# Patient Record
Sex: Female | Born: 1990 | Race: White | Hispanic: No | Marital: Married | Smoking: Former smoker
Health system: Southern US, Community
[De-identification: ages and names within clinical notes are randomized; demographics above are authoritative.]

## PROBLEM LIST (undated history)

## (undated) DIAGNOSIS — B019 Varicella without complication: Secondary | ICD-10-CM

## (undated) DIAGNOSIS — F419 Anxiety disorder, unspecified: Secondary | ICD-10-CM

## (undated) DIAGNOSIS — G43909 Migraine, unspecified, not intractable, without status migrainosus: Secondary | ICD-10-CM

## (undated) DIAGNOSIS — Z87442 Personal history of urinary calculi: Secondary | ICD-10-CM

## (undated) DIAGNOSIS — J324 Chronic pansinusitis: Secondary | ICD-10-CM

## (undated) DIAGNOSIS — N200 Calculus of kidney: Secondary | ICD-10-CM

## (undated) DIAGNOSIS — J309 Allergic rhinitis, unspecified: Secondary | ICD-10-CM

## (undated) DIAGNOSIS — F909 Attention-deficit hyperactivity disorder, unspecified type: Secondary | ICD-10-CM

## (undated) DIAGNOSIS — K219 Gastro-esophageal reflux disease without esophagitis: Secondary | ICD-10-CM

## (undated) DIAGNOSIS — R51 Headache: Secondary | ICD-10-CM

## (undated) DIAGNOSIS — R519 Headache, unspecified: Secondary | ICD-10-CM

## (undated) HISTORY — PX: WISDOM TOOTH EXTRACTION: SHX21

## (undated) HISTORY — DX: Anxiety disorder, unspecified: F41.9

## (undated) HISTORY — DX: Headache, unspecified: R51.9

## (undated) HISTORY — DX: Gastro-esophageal reflux disease without esophagitis: K21.9

## (undated) HISTORY — DX: Headache: R51

## (undated) HISTORY — DX: Chronic pansinusitis: J32.4

## (undated) HISTORY — DX: Allergic rhinitis, unspecified: J30.9

## (undated) HISTORY — DX: Migraine, unspecified, not intractable, without status migrainosus: G43.909

## (undated) HISTORY — DX: Personal history of urinary calculi: Z87.442

## (undated) HISTORY — DX: Attention-deficit hyperactivity disorder, unspecified type: F90.9

## (undated) HISTORY — DX: Varicella without complication: B01.9

---
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
## 2009-01-22 ENCOUNTER — Emergency Department (HOSPITAL_COMMUNITY): Admission: EM | Admit: 2009-01-22 | Discharge: 2009-01-22 | Payer: Self-pay | Admitting: Emergency Medicine

## 2010-11-12 LAB — DIFFERENTIAL
Basophils Absolute: 0
Basophils Relative: 0
Eosinophils Absolute: 0
Eosinophils Relative: 0
Lymphocytes Relative: 6 — ABNORMAL LOW
Lymphs Abs: 0.7 — ABNORMAL LOW
Monocytes Absolute: 0.5
Monocytes Relative: 4
Neutro Abs: 10.6 — ABNORMAL HIGH
Neutrophils Relative %: 90 — ABNORMAL HIGH

## 2010-11-12 LAB — BASIC METABOLIC PANEL
BUN: 11
CO2: 26
Glucose, Bld: 92
Potassium: 3.6
Sodium: 135

## 2010-11-12 LAB — CBC
HCT: 41.6
Hemoglobin: 14.2
MCHC: 34.1
MCV: 86.1
Platelets: 283
RBC: 4.83
RDW: 14
WBC: 11.8 — ABNORMAL HIGH

## 2010-11-12 LAB — HEPATIC FUNCTION PANEL
ALT: 12
AST: 19
Albumin: 3.9
Alkaline Phosphatase: 50
Bilirubin, Direct: 0.1
Indirect Bilirubin: 0.5
Total Bilirubin: 0.6
Total Protein: 7.4

## 2010-11-12 LAB — LIPASE, BLOOD: Lipase: 17

## 2011-02-12 ENCOUNTER — Emergency Department (HOSPITAL_COMMUNITY)
Admission: EM | Admit: 2011-02-12 | Discharge: 2011-02-13 | Disposition: A | Discharge status: Home / Self Care | Payer: 59 | Attending: Emergency Medicine | Admitting: Emergency Medicine

## 2011-02-12 ENCOUNTER — Encounter (HOSPITAL_COMMUNITY): Payer: Self-pay | Admitting: *Deleted

## 2011-02-12 DIAGNOSIS — N23 Unspecified renal colic: Secondary | ICD-10-CM

## 2011-02-12 DIAGNOSIS — R1031 Right lower quadrant pain: Secondary | ICD-10-CM | POA: Insufficient documentation

## 2011-02-12 DIAGNOSIS — M549 Dorsalgia, unspecified: Secondary | ICD-10-CM | POA: Insufficient documentation

## 2011-02-12 DIAGNOSIS — Z79899 Other long term (current) drug therapy: Secondary | ICD-10-CM | POA: Insufficient documentation

## 2011-02-12 LAB — URINALYSIS, ROUTINE W REFLEX MICROSCOPIC
Nitrite: NEGATIVE
Protein, ur: 30 mg/dL — AB
Urobilinogen, UA: 0.2 mg/dL (ref 0.0–1.0)

## 2011-02-12 LAB — CBC
HCT: 37 % (ref 36.0–46.0)
MCHC: 33.2 g/dL (ref 30.0–36.0)
RDW: 12.3 % (ref 11.5–15.5)

## 2011-02-12 LAB — URINE MICROSCOPIC-ADD ON

## 2011-02-12 LAB — PREGNANCY, URINE: Preg Test, Ur: NEGATIVE

## 2011-02-12 MED ORDER — SODIUM CHLORIDE 0.9 % IV SOLN
INTRAVENOUS | Status: DC
  Administered 2011-02-13: 01:00:00 via INTRAVENOUS

## 2011-02-12 NOTE — ED Notes (Signed)
Pt in c/o RLQ abd pain x2 days, worse over last hour, states she went to her PMD and was dx with UTI and right ovarian cyst

## 2011-02-12 NOTE — ED Notes (Signed)
Pt ambulated to the restroom. Will get labs when pt returns.

## 2011-02-13 ENCOUNTER — Emergency Department (HOSPITAL_COMMUNITY): Payer: 59

## 2011-02-13 LAB — COMPREHENSIVE METABOLIC PANEL
Albumin: 3.6 g/dL (ref 3.5–5.2)
Alkaline Phosphatase: 43 U/L (ref 39–117)
BUN: 12 mg/dL (ref 6–23)
Creatinine, Ser: 0.8 mg/dL (ref 0.50–1.10)
Potassium: 3.7 mEq/L (ref 3.5–5.1)
Total Protein: 7 g/dL (ref 6.0–8.3)

## 2011-02-13 LAB — WET PREP, GENITAL
Clue Cells Wet Prep HPF POC: NONE SEEN
Yeast Wet Prep HPF POC: NONE SEEN

## 2011-02-13 LAB — LIPASE, BLOOD: Lipase: 43 U/L (ref 11–59)

## 2011-02-13 MED ORDER — IOHEXOL 300 MG/ML  SOLN
100.0000 mL | Freq: Once | INTRAMUSCULAR | Status: AC | PRN
  Administered 2011-02-13: 100 mL via INTRAVENOUS

## 2011-02-13 MED ORDER — HYDROCODONE-ACETAMINOPHEN 5-500 MG PO TABS: 1.0000 | ORAL_TABLET | Freq: Four times a day (QID) | ORAL | Status: AC | PRN

## 2011-02-13 NOTE — ED Provider Notes (Signed)
History     CSN: [id number]  Arrival date & time 02/12/11  2113   First MD Initiated Contact with Patient 02/12/11 2256      Chief Complaint  Patient presents with  . Abdominal Pain    (Consider location/radiation/quality/duration/timing/severity/associated sxs/prior treatment) Patient is a 21 y.o. female presenting with abdominal pain. The history is provided by the patient and a parent.  Abdominal Pain The primary symptoms of the illness include abdominal pain. The primary symptoms of the illness do not include fever, shortness of breath or dysuria. The current episode started 2 days ago. The onset of the illness was sudden. The problem has been gradually improving.  Associated with: Nothing. The patient states that she believes she is currently not pregnant. The patient has not had a change in bowel habit. Risk factors: None. Additional symptoms associated with the illness include back pain. Symptoms associated with the illness do not include chills, anorexia, diaphoresis, heartburn, constipation, urgency, hematuria or frequency. Significant associated medical issues do not include diabetes, gallstones or liver disease.   patient evaluated at urgent care yesterday for the symptoms and do to a history of ovarian cyst was told that she has the same. She was given antibiotics that she was told was for urinary tract infection. She did not have a pelvic exam performed at that time. She was discharged home with Tramadol. Patient returns tonight with return of severe pain located in right lower quadrant, also some associated lower back pain. She's had some dark urine but has not noticed any hematuria. She has no history of kidney stones. Pain sharp in quality. No radiation. No history of same. No associated nausea or vomiting.  History reviewed. No pertinent past medical history.  History reviewed. No pertinent past surgical history.  History reviewed. No pertinent family history.  History    Substance Use Topics  . Smoking status: Not on file  . Smokeless tobacco: Not on file  . Alcohol Use: Not on file    OB History    Grav Para Term Preterm Abortions TAB SAB Ect Mult Living                  Review of Systems  Constitutional: Negative for fever, chills and diaphoresis.  HENT: Negative for neck pain and neck stiffness.   Eyes: Negative for pain.  Respiratory: Negative for shortness of breath.   Cardiovascular: Negative for chest pain.  Gastrointestinal: Positive for abdominal pain. Negative for heartburn, constipation and anorexia.  Genitourinary: Negative for dysuria, urgency, frequency and hematuria.  Musculoskeletal: Positive for back pain.  Skin: Negative for rash.  Neurological: Negative for headaches.  All other systems reviewed and are negative.    Allergies  Sulfa antibiotics  Home Medications   Current Outpatient Rx  Name Route Sig Dispense Refill  . METRONIDAZOLE 500 MG PO TABS Oral Take 500 mg by mouth 2 (two) times daily. 7 day course of therapy; started 02/11/11    . PRESCRIPTION MEDICATION Oral Take 1 tablet by mouth daily. Birth Control.    . TRAMADOL HCL 50 MG PO TABS Oral Take 50 mg by mouth every 6 (six) hours as needed. For pain.      BP 113/76  Pulse 66  Temp(Src) 97.7 F (36.5 C) (Oral)  Resp 16  SpO2 99%  LMP 02/03/2011  Physical Exam  Constitutional: She is oriented to person, place, and time. She appears well-developed and well-nourished.  HENT:  Head: Normocephalic and atraumatic.  Eyes: Conjunctivae and EOM  are normal. Pupils are equal, round, and reactive to light.  Neck: Trachea normal. Neck supple. No thyromegaly present.  Cardiovascular: Normal rate, regular rhythm, S1 normal, S2 normal and normal pulses.     No systolic murmur is present   No diastolic murmur is present  Pulses:      Radial pulses are 2+ on the right side, and 2+ on the left side.  Pulmonary/Chest: Effort normal and breath sounds normal. She has  no wheezes. She has no rhonchi. She has no rales. She exhibits no tenderness.  Abdominal: Soft. Normal appearance and bowel sounds are normal. She exhibits no mass. There is no rebound, no guarding, no CVA tenderness and negative Murphy's sign.       Tender right lower quadrant. No CVA tenderness. No peritonitis. No abdominal discoloration.  Genitourinary: Vagina normal and uterus normal. No vaginal discharge found.  Musculoskeletal:       BLE:s Calves nontender, no cords or erythema, negative Homans sign  Neurological: She is alert and oriented to person, place, and time. She has normal strength. No cranial nerve deficit or sensory deficit. GCS eye subscore is 4. GCS verbal subscore is 5. GCS motor subscore is 6.  Skin: Skin is warm and dry. No rash noted. She is not diaphoretic.  Psychiatric: Her speech is normal.       Cooperative and appropriate    ED Course  Procedures (including critical care time)  Results for orders placed during the hospital encounter of 02/12/11  URINALYSIS, ROUTINE W REFLEX MICROSCOPIC      Component Value Range   Color, Urine YELLOW  YELLOW    APPearance CLEAR  CLEAR    Specific Gravity, Urine 1.025  1.005 - 1.030    pH 7.0  5.0 - 8.0    Glucose, UA NEGATIVE  NEGATIVE (mg/dL)   Hgb urine dipstick LARGE (*) NEGATIVE    Bilirubin Urine NEGATIVE  NEGATIVE    Ketones, ur NEGATIVE  NEGATIVE (mg/dL)   Protein, ur 30 (*) NEGATIVE (mg/dL)   Urobilinogen, UA 0.2  0.0 - 1.0 (mg/dL)   Nitrite NEGATIVE  NEGATIVE    Leukocytes, UA SMALL (*) NEGATIVE   PREGNANCY, URINE      Component Value Range   Preg Test, Ur NEGATIVE    CBC      Component Value Range   WBC 10.2  4.0 - 10.5 (K/uL)   RBC 4.22  3.87 - 5.11 (MIL/uL)   Hemoglobin 12.3  12.0 - 15.0 (g/dL)   HCT 40.9  81.1 - 91.4 (%)   MCV 87.7  78.0 - 100.0 (fL)   MCH 29.1  26.0 - 34.0 (pg)   MCHC 33.2  30.0 - 36.0 (g/dL)   RDW 78.2  95.6 - 21.3 (%)   Platelets 229  150 - 400 (K/uL)  COMPREHENSIVE METABOLIC  PANEL      Component Value Range   Sodium 138  135 - 145 (mEq/L)   Potassium 3.7  3.5 - 5.1 (mEq/L)   Chloride 104  96 - 112 (mEq/L)   CO2 27  19 - 32 (mEq/L)   Glucose, Bld 93  70 - 99 (mg/dL)   BUN 12  6 - 23 (mg/dL)   Creatinine, Ser 0.86  0.50 - 1.10 (mg/dL)   Calcium 9.0  8.4 - 57.8 (mg/dL)   Total Protein 7.0  6.0 - 8.3 (g/dL)   Albumin 3.6  3.5 - 5.2 (g/dL)   AST 13  0 - 37 (U/L)   ALT  7  0 - 35 (U/L)   Alkaline Phosphatase 43  39 - 117 (U/L)   Total Bilirubin 0.2 (*) 0.3 - 1.2 (mg/dL)   GFR calc non Af Amer >90  >90 (mL/min)   GFR calc Af Amer >90  >90 (mL/min)  LIPASE, BLOOD      Component Value Range   Lipase 43  11 - 59 (U/L)  WET PREP, GENITAL      Component Value Range   Yeast, Wet Prep NONE SEEN  NONE SEEN    Trich, Wet Prep NONE SEEN  NONE SEEN    Clue Cells, Wet Prep NONE SEEN  NONE SEEN    WBC, Wet Prep HPF POC FEW (*) NONE SEEN   URINE MICROSCOPIC-ADD ON      Component Value Range   Squamous Epithelial / LPF RARE  RARE    WBC, UA 0-2  <3 (WBC/hpf)   RBC / HPF TOO NUMEROUS TO COUNT  <3 (RBC/hpf)   Bacteria, UA FEW (*) RARE    Ct Abdomen Pelvis W Contrast  02/13/2011  *RADIOLOGY REPORT*  Clinical Data: Right lower quadrant abdominal pain for 2 days.  Red blood cells and white blood cells in urine.  CT ABDOMEN AND PELVIS WITH CONTRAST  Technique:  Multidetector CT imaging of the abdomen and pelvis was performed following the standard protocol during bolus administration of intravenous contrast.  Contrast: OMNIPAQUE IOHEXOL 300 MG/ML IV SOLN  Comparison: Abdominal ultrasound performed 09/03/2006  Findings: The visualized lung bases are clear.  There is unusually heterogeneous enhancement of the liver.  This raises question for very mild nutmeg liver, due to passive venous congestion, although this is unusual given the patient's age. The spleen is unremarkable in appearance.  The gallbladder is within normal limits.  The pancreas and adrenal glands are  unremarkable.  There is suggestion of two small nonobstructing left renal stones, each measuring 2 mm in size.  It is conceivable that this reflects trace contrast within the renal calyces.  There is no evidence of hydronephrosis.  No obstructing ureteral stones are identified. The right kidney is unremarkable in appearance.  No perinephric stranding is seen.  No free fluid is identified.  The small bowel is unremarkable in appearance.  The stomach is within normal limits.  No acute vascular abnormalities are seen.  The appendix is normal in caliber and contains air, without evidence for appendicitis.  The colon is unremarkable in appearance.  The bladder is mildly distended and grossly unremarkable in appearance.  Air is noted at the vagina; the uterus is grossly unremarkable in appearance.  The ovaries are relatively symmetric; no suspicious adnexal masses are seen.  No inguinal lymphadenopathy is seen.  No acute osseous abnormalities are identified.  IMPRESSION:  1.  No evidence of appendicitis. 2.  Suggestion of two small nonobstructing left renal stones, each measuring 2 mm in size.  This could conceivably instead reflect trace contrast within the renal calyces.  No evidence of hydronephrosis. 3.  Mildly heterogeneous enhancement of the liver raises question for very mild nutmeg liver, due to passive venous congestion, though this is unusual given the patient's age.  Alternatively, it could simply reflect the phase of contrast enhancement.  Original Report Authenticated By: Tonia Ghent, M.D.     she declines pain medicines in the ED. UA CT reviewed as above. Suspect patient has a kidney stone not seen on contrasted CT scan. No significant pain in the ED.  MDM   Right lower quadrant  pain likely ureteral colic. Urology referral. Pain medications and kidney stone precautions.        Sunnie Nielsen, MD 02/13/11 [phone]

## 2012-03-15 ENCOUNTER — Emergency Department (HOSPITAL_COMMUNITY)
Admission: EM | Admit: 2012-03-15 | Discharge: 2012-03-15 | Disposition: A | Discharge status: Home / Self Care | Payer: 59 | Attending: Emergency Medicine | Admitting: Emergency Medicine

## 2012-03-15 ENCOUNTER — Encounter (HOSPITAL_COMMUNITY): Payer: Self-pay | Admitting: *Deleted

## 2012-03-15 DIAGNOSIS — Z87448 Personal history of other diseases of urinary system: Secondary | ICD-10-CM | POA: Insufficient documentation

## 2012-03-15 DIAGNOSIS — Y9389 Activity, other specified: Secondary | ICD-10-CM | POA: Insufficient documentation

## 2012-03-15 DIAGNOSIS — M5380 Other specified dorsopathies, site unspecified: Secondary | ICD-10-CM | POA: Insufficient documentation

## 2012-03-15 DIAGNOSIS — R000 Tachycardia, unspecified: Secondary | ICD-10-CM | POA: Insufficient documentation

## 2012-03-15 DIAGNOSIS — Z79899 Other long term (current) drug therapy: Secondary | ICD-10-CM | POA: Insufficient documentation

## 2012-03-15 DIAGNOSIS — IMO0002 Reserved for concepts with insufficient information to code with codable children: Secondary | ICD-10-CM | POA: Insufficient documentation

## 2012-03-15 DIAGNOSIS — X503XXA Overexertion from repetitive movements, initial encounter: Secondary | ICD-10-CM | POA: Insufficient documentation

## 2012-03-15 DIAGNOSIS — F17200 Nicotine dependence, unspecified, uncomplicated: Secondary | ICD-10-CM | POA: Insufficient documentation

## 2012-03-15 DIAGNOSIS — M5430 Sciatica, unspecified side: Secondary | ICD-10-CM | POA: Insufficient documentation

## 2012-03-15 DIAGNOSIS — M62830 Muscle spasm of back: Secondary | ICD-10-CM

## 2012-03-15 DIAGNOSIS — Y929 Unspecified place or not applicable: Secondary | ICD-10-CM | POA: Insufficient documentation

## 2012-03-15 HISTORY — DX: Calculus of kidney: N20.0

## 2012-03-15 MED ORDER — HYDROCODONE-ACETAMINOPHEN 5-325 MG PO TABS
2.0000 | ORAL_TABLET | Freq: Once | ORAL | Status: AC
  Administered 2012-03-15: 2 via ORAL
  Filled 2012-03-15: qty 2

## 2012-03-15 MED ORDER — DIAZEPAM 5 MG PO TABS
10.0000 mg | ORAL_TABLET | Freq: Once | ORAL | Status: AC
  Administered 2012-03-15: 10 mg via ORAL
  Filled 2012-03-15: qty 2

## 2012-03-15 MED ORDER — METHOCARBAMOL 750 MG PO TABS: 750.0000 mg | ORAL_TABLET | Freq: Four times a day (QID) | ORAL | Status: DC | PRN

## 2012-03-15 MED ORDER — HYDROCODONE-ACETAMINOPHEN 5-325 MG PO TABS: 1.0000 | ORAL_TABLET | Freq: Four times a day (QID) | ORAL | Status: DC | PRN

## 2012-03-15 MED ORDER — NAPROXEN 500 MG PO TABS: 500.0000 mg | ORAL_TABLET | Freq: Two times a day (BID) | ORAL | Status: DC | PRN

## 2012-03-15 NOTE — ED Provider Notes (Signed)
History    This chart was scribed for non-physician practitioner working with Leonette Most B. Bernette Mayers, MD by Donne Anon, ED Scribe. This patient was seen in room WTR9/WTR9 and the patient's care was started at 2137.   CSN: [id number]  Arrival date & time 03/15/12  2037   First MD Initiated Contact with Patient 03/15/12 2137      Chief Complaint  Patient presents with  . Back Pain     The history is provided by the patient and medical records. No language interpreter was used.   Brittany Collins is a 22 y.o. female who presents to the Emergency Department complaining of sudden onset, constant, non changing lower back pain which radiates down to her buttock and right leg and began while she was moving boxes 3.5 hours PTA. She describes the pain as throbbing and stabbing. She reports the pain is worse while sitting and standing, and best when she is lying flat. She denies numbness or tingling in her legs or any other pain. She denies any similar episodes. She tried Excedrin with no relief.  Pain is reproducible with ambulation.  Past Medical History  Diagnosis Date  . Kidney stones     History reviewed. No pertinent past surgical history.  History reviewed. No pertinent family history.  History  Substance Use Topics  . Smoking status: Current Every Day Smoker    Types: Cigarettes  . Smokeless tobacco: Not on file  . Alcohol Use: Yes     Comment: occasionally    Review of Systems  Constitutional: Negative for fever and fatigue.  HENT: Negative for neck pain and neck stiffness.   Respiratory: Negative for chest tightness and shortness of breath.   Cardiovascular: Negative for chest pain.  Gastrointestinal: Negative for nausea, vomiting, abdominal pain and diarrhea.  Genitourinary: Negative for dysuria, urgency, frequency and hematuria.  Musculoskeletal: Positive for back pain and gait problem ( 2/2 pain). Negative for joint swelling.  Skin: Negative for rash.  Neurological: Negative  for weakness, light-headedness, numbness and headaches.  All other systems reviewed and are negative.    Allergies  Tramadol and Sulfa antibiotics  Home Medications   Current Outpatient Rx  Name  Route  Sig  Dispense  Refill  . HYDROcodone-acetaminophen (NORCO/VICODIN) 5-325 MG per tablet   Oral   Take 1 tablet by mouth every 6 (six) hours as needed for pain (Take 1 - 2 tablets every 4 - 6 hours.).   20 tablet   0   . methocarbamol (ROBAXIN) 750 MG tablet   Oral   Take 1 tablet (750 mg total) by mouth 4 (four) times daily as needed (Take 1 tablet every 6 hours as needed for muscle spasms.).   20 tablet   0   . naproxen (NAPROSYN) 500 MG tablet   Oral   Take 1 tablet (500 mg total) by mouth 2 (two) times daily as needed.   30 tablet   0     BP 110/70  Pulse 109  Temp(Src) 98.7 F (37.1 C) (Oral)  Resp 19  Ht 5\' 4"  (1.626 m)  Wt 113 lb (51.256 kg)  BMI 19.39 kg/m2  SpO2 100%  LMP 02/23/2012  Physical Exam  Nursing note and vitals reviewed. Constitutional: She appears well-developed and well-nourished. No distress.  HENT:  Head: Normocephalic and atraumatic.  Mouth/Throat: Oropharynx is clear and moist. No oropharyngeal exudate.  Eyes: Conjunctivae are normal. Pupils are equal, round, and reactive to light. No scleral icterus.  Neck: Normal range of  motion and full passive range of motion without pain. Neck supple. No spinous process tenderness and no muscular tenderness present. Normal range of motion present.  Full ROM without pain  Cardiovascular: Regular rhythm, normal heart sounds and intact distal pulses.  Tachycardia present.  Exam reveals no gallop and no friction rub.   No murmur heard. Pulses:      Radial pulses are 2+ on the right side, and 2+ on the left side.       Dorsalis pedis pulses are 2+ on the right side, and 2+ on the left side.       Posterior tibial pulses are 2+ on the right side, and 2+ on the left side.  Pulmonary/Chest: Effort normal  and breath sounds normal. No respiratory distress. She has no wheezes.  Abdominal: Soft. Bowel sounds are normal. She exhibits no distension and no mass. There is no tenderness. There is no rebound and no guarding.  Musculoskeletal: She exhibits tenderness. She exhibits no edema.       Lumbar back: She exhibits decreased range of motion, tenderness, pain and spasm. She exhibits no bony tenderness, no swelling, no edema, no deformity, no laceration and normal pulse.       Back:  Good distal pulses and sensation. No C or T spine tenderness. Right paraspinal tenderness of the L spine.  Lymphadenopathy:    She has no cervical adenopathy.  Neurological: She is alert. She has normal reflexes. GCS eye subscore is 4. GCS verbal subscore is 5. GCS motor subscore is 6.  Reflex Scores:      Tricep reflexes are 2+ on the right side and 2+ on the left side.      Bicep reflexes are 2+ on the right side and 2+ on the left side.      Brachioradialis reflexes are 2+ on the right side and 2+ on the left side.      Patellar reflexes are 2+ on the right side and 2+ on the left side.      Achilles reflexes are 2+ on the right side and 2+ on the left side. Speech is clear and goal oriented, follows commands Normal strength in upper and lower extremities bilaterally including dorsiflexion and plantar flexion, strong and equal grip strength Sensation normal to light and sharp touch Moves extremities without ataxia, coordination intact Normal gait and balance  Skin: Skin is warm and dry. No rash noted. She is not diaphoretic. No erythema.  Psychiatric: She has a normal mood and affect.    ED Course  Procedures (including critical care time) DIAGNOSTIC STUDIES: Oxygen Saturation is 100% on room air, normal by my interpretation.    COORDINATION OF CARE: 9:43 PM Discussed treatment plan which includes ice, heat, rest, stretching and medication with pt at bedside and pt agreed to plan.     Labs Reviewed - No  data to display No results found.   1. Back muscle spasm   2. Sciatica       MDM  Eudelia Bunch presents with Hx and PE consistent with sciatica.  Pt without trauma and I do not believe that imaging is indicated at this time.  Normal neurological exam, no evidence of urinary incontinence or retention, pain is consistently reproducible. There is no evidence of AAA or concern for dissection at this time.   Patient can walk but states is painful.  No loss of bowel or bladder control.  No concern for cauda equina.  No fever, night sweats, weight loss, h/o  cancer, IVDU.  Pain treated here in the department with adequate improvement. RICE protocol and pain medicine indicated and discussed with patient. I have also discussed reasons to return immediately to the ER.  Patient expresses understanding and agrees with plan.  1. Medications: robaxin, naproxyn, vicodin, usual home medications 2. Treatment: rest, drink plenty of fluids, gentle stretching as discussed, alternate ice and heat 3. Follow Up: Please followup with your primary doctor for discussion of your diagnoses and further evaluation after today's visit; if you do not have a primary care doctor use the resource guide provided to find one;   I personally performed the services described in this documentation, which was scribed in my presence. The recorded information has been reviewed and is accurate.         Dahlia Client Maclean Foister, PA-C 03/15/12 2210

## 2012-03-15 NOTE — ED Notes (Signed)
Pt c/o R lower back pain radiating down R leg since moving heavy boxes at 1700 today. Pt states pain worse w/ position change and best when lying flat.

## 2012-03-15 NOTE — ED Provider Notes (Signed)
Medical screening examination/treatment/procedure(s) were performed by non-physician practitioner and as supervising physician I was immediately available for consultation/collaboration.   Charles B. Bernette Mayers, MD 03/15/12 2211

## 2012-09-03 ENCOUNTER — Emergency Department (HOSPITAL_COMMUNITY)
Admission: EM | Admit: 2012-09-03 | Discharge: 2012-09-03 | Disposition: A | Discharge status: Home / Self Care | Payer: Self-pay | Attending: Emergency Medicine | Admitting: Emergency Medicine

## 2012-09-03 ENCOUNTER — Encounter (HOSPITAL_COMMUNITY): Payer: Self-pay | Admitting: Emergency Medicine

## 2012-09-03 DIAGNOSIS — Z79899 Other long term (current) drug therapy: Secondary | ICD-10-CM | POA: Insufficient documentation

## 2012-09-03 DIAGNOSIS — Z87442 Personal history of urinary calculi: Secondary | ICD-10-CM | POA: Insufficient documentation

## 2012-09-03 DIAGNOSIS — F17200 Nicotine dependence, unspecified, uncomplicated: Secondary | ICD-10-CM | POA: Insufficient documentation

## 2012-09-03 DIAGNOSIS — L259 Unspecified contact dermatitis, unspecified cause: Secondary | ICD-10-CM | POA: Insufficient documentation

## 2012-09-03 MED ORDER — HYDROCORTISONE 2.5 % EX LOTN: TOPICAL_LOTION | Freq: Two times a day (BID) | CUTANEOUS | Status: DC

## 2012-09-03 MED ORDER — PREDNISONE 20 MG PO TABS: 60.0000 mg | ORAL_TABLET | Freq: Every day | ORAL | Status: DC

## 2012-09-03 NOTE — ED Provider Notes (Signed)
CSN: [id number]     Arrival date & time 09/03/12  4098 History     First MD Initiated Contact with Patient 09/03/12 [phone]     Chief Complaint  Patient presents with  . Rash   (Consider location/radiation/quality/duration/timing/severity/associated sxs/prior Treatment) Patient is a 22 y.o. female presenting with rash. The history is provided by the patient.  Rash Location:  Face, shoulder/arm and leg Facial rash location:  R cheek Shoulder/arm rash location:  L forearm Leg rash location:  L upper leg and R upper leg Quality: dryness, itchiness, redness and scaling   Quality: not draining, not painful, not swelling and not weeping   Onset quality:  Gradual Progression:  Worsening Context: not exposure to similar rash, not food, not insect bite/sting, not medications and not new detergent/soap   Relieved by:  Nothing Ineffective treatments:  Anti-itch cream Associated symptoms: no abdominal pain, no fever, no nausea, no shortness of breath, no sore throat, no throat swelling, no tongue swelling and not wheezing     Past Medical History  Diagnosis Date  . Kidney stones    No past surgical history on file. No family history on file. History  Substance Use Topics  . Smoking status: Current Every Day Smoker    Types: Cigarettes  . Smokeless tobacco: Not on file  . Alcohol Use: Yes     Comment: occasionally   OB History   Grav Para Term Preterm Abortions TAB SAB Ect Mult Living                 Review of Systems  Constitutional: Negative for fever and chills.  HENT: Negative for sore throat.   Respiratory: Negative for shortness of breath and wheezing.   Gastrointestinal: Negative for nausea and abdominal pain.  Skin: Positive for rash.    Allergies  Tramadol and Sulfa antibiotics  Home Medications   Current Outpatient Rx  Name  Route  Sig  Dispense  Refill  . dextroamphetamine (DEXTROSTAT) 10 MG tablet   Oral   Take 10 mg by mouth 4 (four) times daily.          . hydrocortisone cream 1 %   Topical   Apply 1 application topically 2 (two) times daily.          BP 110/65  Pulse 101  Temp(Src) 98.2 F (36.8 C)  Resp 16  SpO2 100% Physical Exam  Nursing note and vitals reviewed. Constitutional: She appears well-developed and well-nourished. No distress.  HENT:  Head: Normocephalic and atraumatic.  Mouth/Throat: Oropharynx is clear and moist.  No swelling of lips, tongue, or throat  Neck: Normal range of motion. Neck supple.  Cardiovascular: Normal rate, regular rhythm and normal heart sounds.   Pulmonary/Chest: Effort normal and breath sounds normal. No respiratory distress. She has no wheezes.  Musculoskeletal: Normal range of motion.  Neurological: She is alert.  Skin: Skin is warm and dry. No petechiae and no purpura noted. She is not diaphoretic.  Excoriated erythematous papular rash on the buttocks, left forearm, upper thighs, and right side of the lower face.    ED Course   Procedures (including critical care time)  Labs Reviewed - No data to display No results found. No diagnosis found.  MDM  Patient presenting with a rash.  Appearance of rash most consistent with Contact Dermatitis.  Probable Poison Ivy.  Patient instructed to take Benadryl, apply Hydrocortisone, and also given short course of Prednisone.  Patient stable for discharge.  Kellin Fifer Anne Shutter, PA-C  09/04/12 0815 

## 2012-09-03 NOTE — ED Notes (Signed)
Patches of Rash on arm face  And bottom x 2 days itchy

## 2012-09-09 NOTE — ED Provider Notes (Signed)
Medical screening examination/treatment/procedure(s) were performed by non-physician practitioner and as supervising physician I was immediately available for consultation/collaboration.   Jaclynne Baldo B. Bernette Mayers, MD 09/09/12 1113

## 2012-09-17 ENCOUNTER — Emergency Department (HOSPITAL_COMMUNITY)
Admission: EM | Admit: 2012-09-17 | Discharge: 2012-09-17 | Discharge status: Against Medical Advice | Payer: Self-pay | Attending: Emergency Medicine | Admitting: Emergency Medicine

## 2012-09-17 ENCOUNTER — Encounter (HOSPITAL_COMMUNITY): Payer: Self-pay | Admitting: Emergency Medicine

## 2012-09-17 DIAGNOSIS — R0789 Other chest pain: Secondary | ICD-10-CM | POA: Insufficient documentation

## 2012-09-17 NOTE — ED Notes (Signed)
PT. REPORTS MID CHEST PAIN / UPPER BACK PAIN ONSET THIS AFTERNOON , DENIES INJURY , NO SOB OR NAUSEA .

## 2012-09-22 ENCOUNTER — Emergency Department (HOSPITAL_COMMUNITY)
Admission: EM | Admit: 2012-09-22 | Discharge: 2012-09-22 | Disposition: A | Discharge status: Home / Self Care | Payer: Self-pay | Attending: Emergency Medicine | Admitting: Emergency Medicine

## 2012-09-22 ENCOUNTER — Emergency Department (HOSPITAL_COMMUNITY): Payer: Self-pay

## 2012-09-22 ENCOUNTER — Encounter (HOSPITAL_COMMUNITY): Payer: Self-pay | Admitting: Emergency Medicine

## 2012-09-22 DIAGNOSIS — Z87442 Personal history of urinary calculi: Secondary | ICD-10-CM | POA: Insufficient documentation

## 2012-09-22 DIAGNOSIS — Z3202 Encounter for pregnancy test, result negative: Secondary | ICD-10-CM | POA: Insufficient documentation

## 2012-09-22 DIAGNOSIS — Z79899 Other long term (current) drug therapy: Secondary | ICD-10-CM | POA: Insufficient documentation

## 2012-09-22 DIAGNOSIS — R079 Chest pain, unspecified: Secondary | ICD-10-CM

## 2012-09-22 DIAGNOSIS — F17200 Nicotine dependence, unspecified, uncomplicated: Secondary | ICD-10-CM | POA: Insufficient documentation

## 2012-09-22 DIAGNOSIS — R0789 Other chest pain: Secondary | ICD-10-CM | POA: Insufficient documentation

## 2012-09-22 DIAGNOSIS — R109 Unspecified abdominal pain: Secondary | ICD-10-CM | POA: Insufficient documentation

## 2012-09-22 DIAGNOSIS — M549 Dorsalgia, unspecified: Secondary | ICD-10-CM | POA: Insufficient documentation

## 2012-09-22 LAB — CBC WITH DIFFERENTIAL/PLATELET
Basophils Absolute: 0 10*3/uL (ref 0.0–0.1)
Basophils Relative: 0 % (ref 0–1)
MCHC: 35.4 g/dL (ref 30.0–36.0)
Neutro Abs: 5.9 10*3/uL (ref 1.7–7.7)
Neutrophils Relative %: 69 % (ref 43–77)
Platelets: 205 10*3/uL (ref 150–400)
RDW: 12.8 % (ref 11.5–15.5)

## 2012-09-22 LAB — COMPREHENSIVE METABOLIC PANEL
AST: 16 U/L (ref 0–37)
Albumin: 3.6 g/dL (ref 3.5–5.2)
Alkaline Phosphatase: 54 U/L (ref 39–117)
Chloride: 107 mEq/L (ref 96–112)
Creatinine, Ser: 0.82 mg/dL (ref 0.50–1.10)
Potassium: 3.6 mEq/L (ref 3.5–5.1)
Total Bilirubin: 0.5 mg/dL (ref 0.3–1.2)

## 2012-09-22 LAB — D-DIMER, QUANTITATIVE: D-Dimer, Quant: 0.32 ug/mL-FEU (ref 0.00–0.48)

## 2012-09-22 LAB — TROPONIN I: Troponin I: <0.3 ng/mL (ref <0.30)

## 2012-09-22 MED ORDER — FAMOTIDINE 20 MG PO TABS
20.0000 mg | ORAL_TABLET | Freq: Once | ORAL | Status: AC
  Administered 2012-09-22: 20 mg via ORAL
  Filled 2012-09-22: qty 1

## 2012-09-22 MED ORDER — FAMOTIDINE 20 MG PO TABS: 20.0000 mg | ORAL_TABLET | Freq: Two times a day (BID) | ORAL | Status: DC

## 2012-09-22 NOTE — ED Notes (Signed)
Discharge instructions reviewed with pt. Pt verbalized understanding.   

## 2012-09-22 NOTE — ED Notes (Signed)
Pt c/o right sided CP into back x 5 days; pt sts seen here last Thursday for same but had to leave; pt sts some cough

## 2012-09-22 NOTE — ED Provider Notes (Signed)
CSN: [id number]     Arrival date & time 09/22/12  1041 History   First MD Initiated Contact with Patient 09/22/12 1119     Chief Complaint  Patient presents with  . Chest Pain   (Consider location/radiation/quality/duration/timing/severity/associated sxs/prior Treatment) Patient is a 22 y.o. female presenting with chest pain. The history is provided by the patient.  Chest Pain Associated symptoms: abdominal pain and back pain   Associated symptoms: no fever, no headache, no nausea, no shortness of breath and not vomiting    patient with complaint of chest pain on and off since Thursday returned just today lasted for 2 hours. Then today was for one hour. Not associated with nausea vomiting or shortness of breath. Pain does radiate somewhat to the back. Pain is epigastric radiates into the substernal area and then to the back rates it about an 8/10 when is present. As stated is intermittent. Not made better or worse by anything.  Past Medical History  Diagnosis Date  . Kidney stones    History reviewed. No pertinent past surgical history. History reviewed. No pertinent family history. History  Substance Use Topics  . Smoking status: Current Every Day Smoker    Types: Cigarettes  . Smokeless tobacco: Not on file  . Alcohol Use: Yes     Comment: occasionally   OB History   Grav Para Term Preterm Abortions TAB SAB Ect Mult Living                 Review of Systems  Constitutional: Negative for fever.  HENT: Negative for congestion.   Eyes: Negative for redness.  Respiratory: Positive for chest tightness. Negative for shortness of breath.   Cardiovascular: Positive for chest pain. Negative for leg swelling.  Gastrointestinal: Positive for abdominal pain. Negative for nausea and vomiting.  Genitourinary: Negative for dysuria.  Musculoskeletal: Positive for back pain.  Skin: Negative for rash.  Neurological: Negative for headaches.  Hematological: Does not bruise/bleed easily.   Psychiatric/Behavioral: Negative for confusion.    Allergies  Tramadol and Sulfa antibiotics  Home Medications   Current Outpatient Rx  Name  Route  Sig  Dispense  Refill  . dextroamphetamine (DEXTROSTAT) 10 MG tablet   Oral   Take 10 mg by mouth 4 (four) times daily.         . famotidine (PEPCID) 20 MG tablet   Oral   Take 1 tablet (20 mg total) by mouth 2 (two) times daily.   30 tablet   0    BP 110/57  Pulse 62  Temp(Src) 98.6 F (37 C) (Oral)  Resp 20  SpO2 99%  LMP 09/02/2012 Physical Exam  Nursing note and vitals reviewed. Constitutional: She is oriented to person, place, and time. She appears well-developed and well-nourished. No distress.  HENT:  Head: Normocephalic and atraumatic.  Mouth/Throat: Oropharynx is clear and moist.  Eyes: Conjunctivae are normal. Pupils are equal, round, and reactive to light.  Neck: Normal range of motion. Neck supple.  Cardiovascular: Normal rate, regular rhythm, normal heart sounds and intact distal pulses.   No murmur heard. Pulmonary/Chest: Effort normal and breath sounds normal. No respiratory distress. She has no wheezes. She exhibits tenderness.  Mild anterior reducible chest tenderness.  Abdominal: Soft. Bowel sounds are normal. There is no tenderness.  Musculoskeletal: Normal range of motion. She exhibits no edema.  Neurological: She is alert and oriented to person, place, and time. No cranial nerve deficit. She exhibits normal muscle tone. Coordination normal.  Skin: Skin  is warm. No rash noted. No erythema.    ED Course  Procedures (including critical care time) Labs Review Labs Reviewed  COMPREHENSIVE METABOLIC PANEL - Abnormal; Notable for the following:    Glucose, Bld 64 (*)    All other components within normal limits  PREGNANCY, URINE  D-DIMER, QUANTITATIVE  TROPONIN I  CBC WITH DIFFERENTIAL  LIPASE, BLOOD   Imaging Review Dg Chest 2 View  09/22/2012   CLINICAL DATA:  Sternal chest pain.  EXAM:  CHEST  2 VIEW  COMPARISON:  None.  FINDINGS: There is hyperinflation of the lungs compatible with COPD. Heart and mediastinal contours are within normal limits. No focal opacities or effusions. No acute bony abnormality.  IMPRESSION: COPD. No active disease.   Electronically Signed   By: Charlett Nose   On: 09/22/2012 11:53    Date: 09/22/2012  Rate: 95  Rhythm: normal sinus rhythm  QRS Axis: normal  Intervals: PR shortened  ST/T Wave abnormalities: normal  Conduction Disutrbances:none  Narrative Interpretation:   Old EKG Reviewed: unchanged  No change in EKG compared to 09/17/2012.  MDM   1. Chest pain    Patient's workup for chest pain negative for acute cardiac event negative for pulmonary embolism. Also negative for pancreatitis or any epigastric pathology on lab. Symptoms seem to be most consistent with reflux disease. Will treat with Pepcid for the next 2 weeks. Patient also improved on Pepcid here. Resource guide provided to help her find followup.  Chest x-ray does show some COPD changes. Patient is current everyday smoker. Smoking cessation would be important.   Shelda Jakes, MD 09/22/12 [phone]

## 2016-05-10 ENCOUNTER — Ambulatory Visit: Payer: Self-pay | Admitting: Family

## 2016-06-03 ENCOUNTER — Ambulatory Visit (INDEPENDENT_AMBULATORY_CARE_PROVIDER_SITE_OTHER): Payer: Self-pay | Admitting: Family

## 2016-06-03 ENCOUNTER — Encounter: Payer: Self-pay | Admitting: Family

## 2016-06-03 ENCOUNTER — Other Ambulatory Visit (INDEPENDENT_AMBULATORY_CARE_PROVIDER_SITE_OTHER): Payer: Self-pay

## 2016-06-03 VITALS — BP 120/80 | HR 90 | Temp 98.6°F | Resp 16 | Ht 64.0 in | Wt 131.8 lb

## 2016-06-03 DIAGNOSIS — R5383 Other fatigue: Secondary | ICD-10-CM | POA: Insufficient documentation

## 2016-06-03 DIAGNOSIS — G4710 Hypersomnia, unspecified: Secondary | ICD-10-CM

## 2016-06-03 DIAGNOSIS — F17210 Nicotine dependence, cigarettes, uncomplicated: Secondary | ICD-10-CM

## 2016-06-03 LAB — CBC
HEMATOCRIT: 39.7 % (ref 36.0–46.0)
HEMOGLOBIN: 13.3 g/dL (ref 12.0–15.0)
MCHC: 33.4 g/dL (ref 30.0–36.0)
MCV: 90 fl (ref 78.0–100.0)
PLATELETS: 263 10*3/uL (ref 150.0–400.0)
RBC: 4.41 Mil/uL (ref 3.87–5.11)
RDW: 13.9 % (ref 11.5–15.5)
WBC: 6.2 10*3/uL (ref 4.0–10.5)

## 2016-06-03 LAB — B12 AND FOLATE PANEL
FOLATE: 11.5 ng/mL (ref >5.9)
VITAMIN B 12: 447 pg/mL (ref 211–911)

## 2016-06-03 LAB — TSH: TSH: 0.48 u[IU]/mL (ref 0.35–4.50)

## 2016-06-03 LAB — IBC PANEL
Iron: 53 ug/dL (ref 42–145)
Saturation Ratios: 12.5 % — ABNORMAL LOW (ref 20.0–50.0)
Transferrin: 302 mg/dL (ref 212.0–360.0)

## 2016-06-03 LAB — HEMOGLOBIN A1C: HEMOGLOBIN A1C: 5.2 % (ref 4.6–6.5)

## 2016-06-03 MED ORDER — VARENICLINE TARTRATE 0.5 MG X 11 & 1 MG X 42 PO MISC: ORAL | 0 refills | Status: DC

## 2016-06-03 MED ORDER — VARENICLINE TARTRATE 1 MG PO TABS: 1.0000 mg | ORAL_TABLET | Freq: Two times a day (BID) | ORAL | 2 refills | Status: DC

## 2016-06-03 NOTE — Assessment & Plan Note (Signed)
EPSS of 13. Concern for sleep apnea or possible narcolepsy. Refer to sleep medicine for further assessment and treatment. Recommend good sleep hygiene. Continue to monitor.

## 2016-06-03 NOTE — Progress Notes (Signed)
Subjective:    Patient ID: Brittany Collins, female    DOB: 02/19/1990, 26 y.o.   MRN: [id number]  Chief Complaint  Patient presents with  . Establish Care    wants to stop smoking and would like to try something to help quit, fatigue, loss of energy and has been dealing with this for years    HPI:  Brittany Collins is a 26 y.o. female who  has a past medical history of Anxiety; Chicken pox; GERD (gastroesophageal reflux disease); Headache; Kidney stones; and Migraines. and presents today for an office visit to establish care.   1.) Tobacco usage - Currently every day smoker. Has been smoking approximately 1 pack per day for a total of 7 years. Is interested in quitting smoking. Previous attempts have been with the "cold [place]" method and also with vaping which was unsuccessful. Not interested in gums. Open to medication.   2.) Fatigue - Currently experiencing the associated symptom of fatigue and lack of energy that has been going on for several years since she was in high school. Severity of fatigue comes in waves and described exhaustion all of a sudden and feeling like she can fall asleep. Describes having seen hallucinations type things with these events. This can happen at periods of time during the day. She is currently maintained on Vyvanse ADHD which does help her stay awake at times. Has been tested for anemia and thyroid function which were both normal. Has never been tested for diabetes. Sleep average about 7-8 hours and described feeling well rested. Hypersomnolence throughout the day. Headaches in the morning and evenings. No chest pain or shortness of breath on a regular basis. No urinary frequency at night.   Allergies  Allergen Reactions  . Tramadol Other (See Comments)    headache  . Sulfa Antibiotics Other (See Comments)    Reaction unknown      Outpatient Medications Prior to Visit  Medication Sig Dispense Refill  . dextroamphetamine (DEXTROSTAT) 10 MG tablet  Take 10 mg by mouth 4 (four) times daily.    . famotidine (PEPCID) 20 MG tablet Take 1 tablet (20 mg total) by mouth 2 (two) times daily. 30 tablet 0   No facility-administered medications prior to visit.      Past Medical History:  Diagnosis Date  . Anxiety   . Chicken pox   . GERD (gastroesophageal reflux disease)   . Headache   . Kidney stones   . Migraines       Past Surgical History:  Procedure Laterality Date  . WISDOM TOOTH EXTRACTION        Family History  Problem Relation Age of Onset  . Arthritis Mother   . Hypertension Mother   . Sleep apnea Mother   . Heart disease Father   . Heart disease Maternal Grandmother   . Diabetes Maternal Grandmother   . Sleep apnea Maternal Grandmother   . Heart disease Maternal Grandfather   . Diabetes Maternal Grandfather   . Stroke Paternal Grandfather       Social History   Social History  . Marital status: Married    Spouse name: N/A  . Number of children: 0  . Years of education: 14   Occupational History  . Groomer    Social History Main Topics  . Smoking status: Current Every Day Smoker    Packs/day: 1.00    Years: 7.00    Types: Cigarettes  . Smokeless tobacco: Never Used  . Alcohol use  Yes     Comment: <1 per month  . Drug use: No  . Sexual activity: Yes    Birth control/ protection: None   Other Topics Concern  . Not on file   Social History Narrative   Fun/Hobby: Play music   Denies abuse and feels safe at home.       Review of Systems  Constitutional: Positive for fatigue. Negative for appetite change, chills and fever.  Respiratory: Negative for chest tightness and shortness of breath.   Cardiovascular: Negative for chest pain, palpitations and leg swelling.       Objective:    BP 120/80 (BP Location: Left Arm, Patient Position: Sitting, Cuff Size: Normal)   Pulse 90   Temp 98.6 F (37 C) (Oral)   Resp 16   Ht 5\' 4"  (1.626 m)   Wt 131 lb 12.8 oz (59.8 kg)   SpO2 98%   BMI  22.62 kg/m  Nursing note and vital signs reviewed.  Physical Exam  Constitutional: She is oriented to person, place, and time. She appears well-developed and well-nourished. No distress.  Cardiovascular: Normal rate, regular rhythm, normal heart sounds and intact distal pulses.   Pulmonary/Chest: Effort normal and breath sounds normal.  Neurological: She is alert and oriented to person, place, and time.  Skin: Skin is warm and dry.  Psychiatric: She has a normal mood and affect. Her behavior is normal. Judgment and thought content normal.        Assessment & Plan:   Problem List Items Addressed This Visit      Other   Fatigue - Primary    Fatigue with concern for metabolic or sleep origin. Cannot rule out depression or cardiovascular disease. Obtain B12/Folate, CBC, A1c, IBC panel and TSH. Refer to sleep medicine with EPSS of 13 and concern for sleep apnea or possible narcolepsy. Follow up pending blood work and sleep medicine referral.       Relevant Orders   CBC   IBC panel   Hemoglobin A1c   TSH   B12 and Folate Panel   Cigarette nicotine dependence, uncomplicated    7 pack year history and in the contemplation stage of quitting. Discussed methods of cessation including patches, gums and medication with patient wishing to purse Chantix. Information in AVS provided. Follow up pending trial of medication.       Relevant Medications   varenicline (CHANTIX STARTING MONTH PAK) 0.5 MG X 11 & 1 MG X 42 tablet   varenicline (CHANTIX CONTINUING MONTH PAK) 1 MG tablet   Hypersomnolence    EPSS of 13. Concern for sleep apnea or possible narcolepsy. Refer to sleep medicine for further assessment and treatment. Recommend good sleep hygiene. Continue to monitor.       Relevant Orders   Ambulatory referral to Neurology       I have discontinued Ms. Slevin's dextroamphetamine and famotidine. I am also having her start on varenicline and varenicline. Additionally, I am having her  maintain her lisdexamfetamine and FLUoxetine.   Meds ordered this encounter  Medications  . lisdexamfetamine (VYVANSE) 20 MG capsule    Sig: Take 20 mg by mouth daily.  Marland Kitchen FLUoxetine (PROZAC) 10 MG tablet    Sig: Take 10 mg by mouth daily.  . varenicline (CHANTIX STARTING MONTH PAK) 0.5 MG X 11 & 1 MG X 42 tablet    Sig: Take one 0.5 mg tablet by mouth once daily for 3 days, then increase to one 0.5 mg tablet twice daily  for 4 days, then increase to one 1 mg tablet twice daily.    Dispense:  53 tablet    Refill:  0    Order Specific Question:   Supervising Provider    Answer:   Hillard Danker A [[id number]]  . varenicline (CHANTIX CONTINUING MONTH PAK) 1 MG tablet    Sig: Take 1 tablet (1 mg total) by mouth 2 (two) times daily.    Dispense:  60 tablet    Refill:  2    Please fill at the completion of the start pack.    Order Specific Question:   Supervising Provider    Answer:   Hillard Danker A [[id number]]     Follow-up: Return in about 3 months (around 09/03/2016), or if symptoms worsen or fail to improve.  Jeanine Luz, FNP

## 2016-06-03 NOTE — Patient Instructions (Addendum)
Thank you for choosing [facility].  SUMMARY AND INSTRUCTIONS:  They will call to schedule your appointment with sleep medicine.  Schedule a time for your physical at your convenience.   Medication:  Your prescription(s) have been submitted to your pharmacy or been printed and provided for you. Please take as directed and contact our office if you believe you are having problem(s) with the medication(s) or have any questions.  Labs:  Please stop by the lab on the lower level of the building for your blood work. Your results will be released to MyChart (or called to you) after review, usually within 72 hours after test completion. If any changes need to be made, you will be notified at that same time.  1.) The lab is open from 7:30am to 5:30 pm Monday-Friday 2.) No appointment is necessary 3.) Fasting (if needed) is 6-8 hours after food and drink; black coffee and water are okay    Referrals:  Referrals have been made during this visit. You should expect to hear back from our schedulers in about 7-10 days in regards to establishing an appointment with the specialists we discussed.   Follow up:  If your symptoms worsen or fail to improve, please contact our office for further instruction, or in case of emergency go directly to the emergency room at the closest medical facility.    Fatigue Fatigue is feeling tired all of the time, a lack of energy, or a lack of motivation. Occasional or mild fatigue is often a normal response to activity or life in general. However, long-lasting (chronic) or extreme fatigue may indicate an underlying medical condition. Follow these instructions at home: Watch your fatigue for any changes. The following actions may help to lessen any discomfort you are feeling:  Talk to your health care provider about how much sleep you need each night. Try to get the required amount every night.  Take medicines only as directed by your health care  provider.  Eat a healthy and nutritious diet. Ask your health care provider if you need help changing your diet.  Drink enough fluid to keep your urine clear or pale yellow.  Practice ways of relaxing, such as yoga, meditation, massage therapy, or acupuncture.  Exercise regularly.  Change situations that cause you stress. Try to keep your work and personal routine reasonable.  Do not abuse illegal drugs.  Limit alcohol intake to no more than 1 drink per day for nonpregnant women and 2 drinks per day for men. One drink equals 12 ounces of beer, 5 ounces of wine, or 1 ounces of hard liquor.  Take a multivitamin, if directed by your health care provider. Contact a health care provider if:  Your fatigue does not get better.  You have a fever.  You have unintentional weight loss or gain.  You have headaches.  You have difficulty:  Falling asleep.  Sleeping throughout the night.  You feel angry, guilty, anxious, or sad.  You are unable to have a bowel movement (constipation).  You skin is dry.  Your legs or another part of your body is swollen. Get help right away if:  You feel confused.  Your vision is blurry.  You feel faint or pass out.  You have a severe headache.  You have severe abdominal, pelvic, or back pain.  You have chest pain, shortness of breath, or an irregular or fast heartbeat.  You are unable to urinate or you urinate less than normal.  You develop abnormal bleeding,  such as bleeding from the rectum, vagina, nose, lungs, or nipples.  You vomit blood.  You have thoughts about harming yourself or committing suicide.  You are worried that you might harm someone else. This information is not intended to replace advice given to you by your health care provider. Make sure you discuss any questions you have with your health care provider. Document Released: 11/11/2006 Document Revised: 06/22/2015 Document Reviewed: 05/18/2013 Elsevier Interactive  Patient Education  2017 Elsevier Inc.   Smoking Hazards Smoking cigarettes is extremely bad for your health. Tobacco smoke has over 200 known poisons in it. It contains the poisonous gases nitrogen oxide and carbon monoxide. There are over 60 chemicals in tobacco smoke that cause cancer. Some of the chemicals found in cigarette smoke include:   Cyanide.   Benzene.   Formaldehyde.   Methanol (wood alcohol).   Acetylene (fuel used in welding torches).   Ammonia.  Even smoking lightly shortens your life expectancy by several years. You can greatly reduce the risk of medical problems for you and your family by stopping now. Smoking is the most preventable cause of death and disease in our society. Within days of quitting smoking, your circulation improves, you decrease the risk of having a heart attack, and your lung capacity improves. There may be some increased phlegm in the first few days after quitting, and it may take months for your lungs to clear up completely. Quitting for 10 years reduces your risk of developing lung cancer to almost that of a nonsmoker.  WHAT ARE THE RISKS OF SMOKING? Cigarette smokers have an increased risk of many serious medical problems, including:  Lung cancer.   Lung disease (such as pneumonia, bronchitis, and emphysema).   Heart attack and chest pain due to the heart not getting enough oxygen (angina).   Heart disease and peripheral blood vessel disease.   Hypertension.   Stroke.   Oral cancer (cancer of the lip, mouth, or voice box).   Bladder cancer.   Pancreatic cancer.   Cervical cancer.   Pregnancy complications, including premature birth.   Stillbirths and smaller newborn babies, birth defects, and genetic damage to sperm.   Early menopause.   Lower estrogen level for women.   Infertility.   Facial wrinkles.   Blindness.   Increased risk of broken bones (fractures).   Senile dementia.   Stomach  ulcers and internal bleeding.   Delayed wound healing and increased risk of complications during surgery. Because of secondhand smoke exposure, children of smokers have an increased risk of the following:   Sudden infant death syndrome (SIDS).   Respiratory infections.   Lung cancer.   Heart disease.   Ear infections.  WHY IS SMOKING ADDICTIVE? Nicotine is the chemical agent in tobacco that is capable of causing addiction or dependence. When you smoke and inhale, nicotine is absorbed rapidly into the bloodstream through your lungs. Both inhaled and noninhaled nicotine may be addictive.  WHAT ARE THE BENEFITS OF QUITTING?  There are many health benefits to quitting smoking. Some are:   The likelihood of developing cancer and heart disease decreases. Health improvements are seen almost immediately.   Blood pressure, pulse rate, and breathing patterns start returning to normal soon after quitting.   People who quit may see an improvement in their overall quality of life.  HOW DO YOU QUIT SMOKING? Smoking is an addiction with both physical and psychological effects, and longtime habits can be hard to change. Your health care provider can  recommend:  Programs and community resources, which may include group support, education, or therapy.  Replacement products, such as patches, gum, and nasal sprays. Use these products only as directed. Do not replace cigarette smoking with electronic cigarettes (commonly called e-cigarettes). The safety of e-cigarettes is unknown, and some may contain harmful chemicals. FOR MORE INFORMATION  American Lung Association: www.lung.org  American Cancer Society: www.cancer.org   This information is not intended to replace advice given to you by your health care provider. Make sure you discuss any questions you have with your health care provider.   Document Released: 02/22/2004 Document Revised: 11/04/2012 Document Reviewed: 07/06/2012 Elsevier  Interactive Patient Education 2016 [employer].  Steps to Quit Smoking  Smoking tobacco can be harmful to your health and can affect almost every organ in your body. Smoking puts you, and those around you, at risk for developing many serious chronic diseases. Quitting smoking is difficult, but it is one of the best things that you can do for your health. It is never too late to quit. WHAT ARE THE BENEFITS OF QUITTING SMOKING? When you quit smoking, you lower your risk of developing serious diseases and conditions, such as:  Lung cancer or lung disease, such as COPD.  Heart disease.  Stroke.  Heart attack.  Infertility.  Osteoporosis and bone fractures. Additionally, symptoms such as coughing, wheezing, and shortness of breath may get better when you quit. You may also find that you get sick less often because your body is stronger at fighting off colds and infections. If you are pregnant, quitting smoking can help to reduce your chances of having a baby of low birth weight. HOW DO I GET READY TO QUIT? When you decide to quit smoking, create a plan to make sure that you are successful. Before you quit:  Pick a date to quit. Set a date within the next two weeks to give you time to prepare.  Write down the reasons why you are quitting. Keep this list in places where you will see it often, such as on your bathroom mirror or in your car or wallet.  Identify the people, places, things, and activities that make you want to smoke (triggers) and avoid them. Make sure to take these actions:  Throw away all cigarettes at home, at work, and in your car.  Throw away smoking accessories, such as Set designer.  Clean your car and make sure to empty the ashtray.  Clean your home, including curtains and carpets.  Tell your family, friends, and coworkers that you are quitting. Support from your loved ones can make quitting easier.  Talk with your health care provider about your options  for quitting smoking.  Find out what treatment options are covered by your health insurance. WHAT STRATEGIES CAN I USE TO QUIT SMOKING?  Talk with your healthcare provider about different strategies to quit smoking. Some strategies include:  Quitting smoking altogether instead of gradually lessening how much you smoke over a period of time. Research shows that quitting "cold [place]" is more successful than gradually quitting.  Attending in-person counseling to help you build problem-solving skills. You are more likely to have success in quitting if you attend several counseling sessions. Even short sessions of 10 minutes can be effective.  Finding resources and support systems that can help you to quit smoking and remain smoke-free after you quit. These resources are most helpful when you use them often. They can include:  Online chats with a Veterinary surgeon.  Telephone  quitlines.  Printed Materials engineer.  Support groups or group counseling.  Text messaging programs.  Mobile phone applications.  Taking medicines to help you quit smoking. (If you are pregnant or breastfeeding, talk with your health care provider first.) Some medicines contain nicotine and some do not. Both types of medicines help with cravings, but the medicines that include nicotine help to relieve withdrawal symptoms. Your health care provider may recommend:  Nicotine patches, gum, or lozenges.  Nicotine inhalers or sprays.  Non-nicotine medicine that is taken by mouth. Talk with your health care provider about combining strategies, such as taking medicines while you are also receiving in-person counseling. Using these two strategies together makes you more likely to succeed in quitting than if you used either strategy on its own. If you are pregnant or breastfeeding, talk with your health care provider about finding counseling or other support strategies to quit smoking. Do not take medicine to help you quit smoking  unless told to do so by your health care provider. WHAT THINGS CAN I DO TO MAKE IT EASIER TO QUIT? Quitting smoking might feel overwhelming at first, but there is a lot that you can do to make it easier. Take these important actions:  Reach out to your family and friends and ask that they support and encourage you during this time. Call telephone quitlines, reach out to support groups, or work with a counselor for support.  Ask people who smoke to avoid smoking around you.  Avoid places that trigger you to smoke, such as bars, parties, or smoke-break areas at work.  Spend time around people who do not smoke.  Lessen stress in your life, because stress can be a smoking trigger for some people. To lessen stress, try:  Exercising regularly.  Deep-breathing exercises.  Yoga.  Meditating.  Performing a body scan. This involves closing your eyes, scanning your body from head to toe, and noticing which parts of your body are particularly tense. Purposefully relax the muscles in those areas.  Download or purchase mobile phone or tablet apps (applications) that can help you stick to your quit plan by providing reminders, tips, and encouragement. There are many free apps, such as QuitGuide from the [employer] developer for Disease Control and Prevention). You can find other support for quitting smoking (smoking cessation) through smokefree.gov and other websites. HOW WILL I FEEL WHEN I QUIT SMOKING? Within the first 24 hours of quitting smoking, you may start to feel some withdrawal symptoms. These symptoms are usually most noticeable 2-3 days after quitting, but they usually do not last beyond 2-3 weeks. Changes or symptoms that you might experience include:  Mood swings.  Restlessness, anxiety, or irritation.  Difficulty concentrating.  Dizziness.  Strong cravings for sugary foods in addition to nicotine.  Mild weight gain.  Constipation.  Nausea.  Coughing or a sore throat.  Changes in  how your medicines work in your body.  A depressed mood.  Difficulty sleeping (insomnia). After the first 2-3 weeks of quitting, you may start to notice more positive results, such as:  Improved sense of smell and taste.  Decreased coughing and sore throat.  Slower heart rate.  Lower blood pressure.  Clearer skin.  The ability to breathe more easily.  Fewer sick days. Quitting smoking is very challenging for most people. Do not get discouraged if you are not successful the first time. Some people need to make many attempts to quit before they achieve long-term success. Do your best to stick to  your quit plan, and talk with your health care provider if you have any questions or concerns.   This information is not intended to replace advice given to you by your health care provider. Make sure you discuss any questions you have with your health care provider.   Document Released: 01/08/2001 Document Revised: 05/31/2014 Document Reviewed: 05/31/2014 Elsevier Interactive Patient Education 2016 [employer].  Smoking Cessation, Tips for Success If you are ready to quit smoking, congratulations! You have chosen to help yourself be healthier. Cigarettes bring nicotine, tar, carbon monoxide, and other irritants into your body. Your lungs, heart, and blood vessels will be able to work better without these poisons. There are many different ways to quit smoking. Nicotine gum, nicotine patches, a nicotine inhaler, or nicotine nasal spray can help with physical craving. Hypnosis, support groups, and medicines help break the habit of smoking. WHAT THINGS CAN I DO TO MAKE QUITTING EASIER?  Here are some tips to help you quit for good:  Pick a date when you will quit smoking completely. Tell all of your friends and family about your plan to quit on that date.  Do not try to slowly cut down on the number of cigarettes you are smoking. Pick a quit date and quit smoking completely starting on that  day.  Throw away all cigarettes.   Clean and remove all ashtrays from your home, work, and car.  On a card, write down your reasons for quitting. Carry the card with you and read it when you get the urge to smoke.  Cleanse your body of nicotine. Drink enough water and fluids to keep your urine clear or pale yellow. Do this after quitting to flush the nicotine from your body.  Learn to predict your moods. Do not let a bad situation be your excuse to have a cigarette. Some situations in your life might tempt you into wanting a cigarette.  Never have "just one" cigarette. It leads to wanting another and another. Remind yourself of your decision to quit.  Change habits associated with smoking. If you smoked while driving or when feeling stressed, try other activities to replace smoking. Stand up when drinking your coffee. Brush your teeth after eating. Sit in a different chair when you read the paper. Avoid alcohol while trying to quit, and try to drink fewer caffeinated beverages. Alcohol and caffeine may urge you to smoke.  Avoid foods and drinks that can trigger a desire to smoke, such as sugary or spicy foods and alcohol.  Ask people who smoke not to smoke around you.  Have something planned to do right after eating or having a cup of coffee. For example, plan to take a walk or exercise.  Try a relaxation exercise to calm you down and decrease your stress. Remember, you may be tense and nervous for the first 2 weeks after you quit, but this will pass.  Find new activities to keep your hands busy. Play with a pen, coin, or rubber band. Doodle or draw things on paper.  Brush your teeth right after eating. This will help cut down on the craving for the taste of tobacco after meals. You can also try mouthwash.   Use oral substitutes in place of cigarettes. Try using lemon drops, carrots, cinnamon sticks, or chewing gum. Keep them handy so they are available when you have the urge to  smoke.  When you have the urge to smoke, try deep breathing.  Designate your home as a nonsmoking area.  If you are a heavy smoker, ask your health care provider about a prescription for nicotine chewing gum. It can ease your withdrawal from nicotine.  Reward yourself. Set aside the cigarette money you save and buy yourself something nice.  Look for support from others. Join a support group or smoking cessation program. Ask someone at home or at work to help you with your plan to quit smoking.  Always ask yourself, "Do I need this cigarette or is this just a reflex?" Tell yourself, "Today, I choose not to smoke," or "I do not want to smoke." You are reminding yourself of your decision to quit.  Do not replace cigarette smoking with electronic cigarettes (commonly called e-cigarettes). The safety of e-cigarettes is unknown, and some may contain harmful chemicals.  If you relapse, do not give up! Plan ahead and think about what you will do the next time you get the urge to smoke. HOW WILL I FEEL WHEN I QUIT SMOKING? You may have symptoms of withdrawal because your body is used to nicotine (the addictive substance in cigarettes). You may crave cigarettes, be irritable, feel very hungry, cough often, get headaches, or have difficulty concentrating. The withdrawal symptoms are only temporary. They are strongest when you first quit but will go away within 10-14 days. When withdrawal symptoms occur, stay in control. Think about your reasons for quitting. Remind yourself that these are signs that your body is healing and getting used to being without cigarettes. Remember that withdrawal symptoms are easier to treat than the major diseases that smoking can cause.  Even after the withdrawal is over, expect periodic urges to smoke. However, these cravings are generally short lived and will go away whether you smoke or not. Do not smoke! WHAT RESOURCES ARE AVAILABLE TO HELP ME QUIT SMOKING? Your health care  provider can direct you to community resources or hospitals for support, which may include:  Group support.  Education.  Hypnosis.  Therapy.   This information is not intended to replace advice given to you by your health care provider. Make sure you discuss any questions you have with your health care provider.   Document Released: 10/13/2003 Document Revised: 02/04/2014 Document Reviewed: 07/02/2012 Elsevier Interactive Patient Education [employer]2016 Elsevier Inc.

## 2016-06-03 NOTE — Assessment & Plan Note (Signed)
7 pack year history and in the contemplation stage of quitting. Discussed methods of cessation including patches, gums and medication with patient wishing to purse Chantix. Information in AVS provided. Follow up pending trial of medication.

## 2016-06-03 NOTE — Assessment & Plan Note (Signed)
Fatigue with concern for metabolic or sleep origin. Cannot rule out depression or cardiovascular disease. Obtain B12/Folate, CBC, A1c, IBC panel and TSH. Refer to sleep medicine with EPSS of 13 and concern for sleep apnea or possible narcolepsy. Follow up pending blood work and sleep medicine referral.

## 2016-07-04 ENCOUNTER — Telehealth: Payer: Self-pay

## 2016-07-04 ENCOUNTER — Encounter: Payer: Self-pay | Admitting: Neurology

## 2016-07-04 ENCOUNTER — Ambulatory Visit (INDEPENDENT_AMBULATORY_CARE_PROVIDER_SITE_OTHER): Payer: Self-pay | Admitting: Neurology

## 2016-07-04 VITALS — BP 113/70 | HR 82 | Resp 20 | Ht 64.0 in | Wt 128.0 lb

## 2016-07-04 DIAGNOSIS — G4753 Recurrent isolated sleep paralysis: Secondary | ICD-10-CM

## 2016-07-04 DIAGNOSIS — G47419 Narcolepsy without cataplexy: Secondary | ICD-10-CM

## 2016-07-04 DIAGNOSIS — R442 Other hallucinations: Secondary | ICD-10-CM

## 2016-07-04 DIAGNOSIS — G4719 Other hypersomnia: Secondary | ICD-10-CM

## 2016-07-04 NOTE — Progress Notes (Signed)
SLEEP MEDICINE CLINIC   Provider:  Melvyn Novasarmen  Argelio Granier, [place]  Primary Care Physician:  Veryl Speakalone, Gregory D, FNP   Referring Provider: Veryl Speakalone, Gregory D, FNP [place]    Chief Complaint  Patient presents with  . New Patient (Initial Visit)    never had a sleep study, daytime fatigue    HPI:  Brittany Collins is a 26 y.o. female , [facility],    Chief complaint according to patient : Brittany Collins Reports that ever since she left high school she has dealt with chronic fatigue. She described that about 10 years ago or longer she would have sometimes spells of jumping out of bed and suddenly losing muscle tone, falling. These spells have not happened several years. Fatigue is main concern-  that is sometimes  Paralyzing, but her heart rate will exacerbate to a very fast rate. She experiences palpitations. She feels that she can hardly maintain wakefulness and physical functioning. She is not every day all the time fatigued but experiences waves or spells. These occur every day they last less than an hour sometimes only a couple of minutes. She used the term waves that are coming on all of a sudden she will have exhausted all energy, the feeling that she could go to sleep instantly. This can happen several times a day, and she describes some vivid dream like stages, visual hypnopompic hallucinations. She has experienced isolated cases of sleep paralysis- following naps or nocturnal sleep. She has not experienced cataplexy, cannot recall being startled or angry and losing a function of motor tone following this. Her sleep is usually 6-7 hours at night, wakes up once- mostly feeling hungry and goes right back to sleep. Lucid dreams , but not nightmarish. She reports that she often and her sleep through the dream like stage. It may be enough to just close her eyes and a dream begins.   Sleep habits are as follows: She goes to bed between  10 and 11 PM, she has a bedroom with her husband. She is asleep within 5 minutes. She sleeps on her side, sleeps on one pillow. Cool quiet and dark bedroom that is comfortable. The couple sleeps with her 2 dogs in the same bed, but she does not feel that this interrupts her sleep. She sets an alarm around 8 AM but usually wakes up between 7:30 and 8 AM,  spontaneously. She takes every day at least one nap, cannot take more due to her work hours but feels that she could take naps if allowed. On weekends she will have a nap after breakfast,lasting 30 minutes. On weekends she also will have afternoon naps.   Sleep medical history and family sleep history:  The patient's mother suffers from sleep apnea, there is no other family member known to be excessively daytime sleepy. Her brother reports that he feels tired but she's not sure that he has a sleep disorder. The patient has no history of ENT trauma or surgery, traumatic brain injury or neck injuries. The patient is not aware of being a sleep walker, if she had night terrors or enuresis.    Social history: married, no children, dog groomer.  The patient smokes a pack per day, feels that this helps her to stay awake. She needs to smoke when driving to not fall asleep. She drinks alcohol rarely, caffeine one cup a day, she takes Vyvanse to treat ADHD symptoms, she  takes it in the morning it does help her to stay awake at times not for the whole time. The patient underwent thyroid and anemia testing which returned normal per report. She has no history of chest pain, angina, shortness of breath, nausea, dizziness.    Review of Systems: Out of a complete 14 system review, the patient complains of only the following symptoms, and all other reviewed systems are negative. She wakes up with headache.  How likely are you to doze in the following situations: 0 = not likely, 1 = slight chance, 2 = moderate chance, 3 = high chance  Sitting and Reading?  3 Watching Television? 3 Sitting inactive in a public place (theater or meeting)?1 As a passenger in a car for an hour without a break?3  Lying down in the afternoon when circumstances permit?3 Sitting and talking to someone? 1 Sitting quietly after lunch without alcohol?3 In a car, while stopped for a few minutes in traffic? 1 Total = 18    Epworth score 18 , Fatigue severity score 53  ,    Social History   Social History  . Marital status: Married    Spouse name: N/A  . Number of children: 0  . Years of education: 14   Occupational History  . Groomer    Social History Main Topics  . Smoking status: Current Every Day Smoker    Packs/day: 1.00    Years: 7.00    Types: Cigarettes  . Smokeless tobacco: Never Used  . Alcohol use Yes     Comment: <1 per month  . Drug use: No  . Sexual activity: Yes    Birth control/ protection: None   Other Topics Concern  . Not on file   Social History Narrative   Fun/Hobby: Play music   Denies abuse and feels safe at home.     Family History  Problem Relation Age of Onset  . Arthritis Mother   . Hypertension Mother   . Sleep apnea Mother   . Heart disease Father   . Heart disease Maternal Grandmother   . Diabetes Maternal Grandmother   . Sleep apnea Maternal Grandmother   . Heart disease Maternal Grandfather   . Diabetes Maternal Grandfather   . Stroke Paternal Grandfather     Past Medical History:  Diagnosis Date  . Anxiety   . Chicken pox   . GERD (gastroesophageal reflux disease)   . Headache   . Kidney stones   . Migraines     Past Surgical History:  Procedure Laterality Date  . WISDOM TOOTH EXTRACTION      Current Outpatient Prescriptions  Medication Sig Dispense Refill  . FLUoxetine (PROZAC) 10 MG tablet Take 10 mg by mouth daily.    Marland Kitchen lisdexamfetamine (VYVANSE) 20 MG capsule Take 20 mg by mouth daily.    . varenicline (CHANTIX CONTINUING MONTH PAK) 1 MG tablet Take 1 tablet (1 mg total) by mouth 2  (two) times daily. (Patient not taking: Reported on 07/04/2016) 60 tablet 2  . varenicline (CHANTIX STARTING MONTH PAK) 0.5 MG X 11 & 1 MG X 42 tablet Take one 0.5 mg tablet by mouth once daily for 3 days, then increase to one 0.5 mg tablet twice daily for 4 days, then increase to one 1 mg tablet twice daily. (Patient not taking: Reported on 07/04/2016) 53 tablet 0   No current facility-administered medications for this visit.     Allergies as of 07/04/2016 - Review Complete 07/04/2016  Allergen  Reaction Noted  . Tramadol Other (See Comments) 03/15/2012  . Sulfa antibiotics Other (See Comments) 02/12/2011    Vitals: There were no vitals taken for this visit. Last Weight:  Wt Readings from Last 1 Encounters:  06/03/16 131 lb 12.8 oz (59.8 kg)   WUJ:WJXBJ is no height or weight on file to calculate BMI.     Last Height:   Ht Readings from Last 1 Encounters:  06/03/16 5\' 4"  (1.626 m)    Physical exam: BMI is 22. 6   General: The patient is awake, alert and appears not in acute distress. The patient is well groomed. Head: Normocephalic, atraumatic. Neck is supple. Mallampati 2- ,  neck circumference:14. Nasal airflow patent . Retrognathia is not seen. Tongue is pierced Cardiovascular:  Regular rate and rhythm , without  murmurs or carotid bruit, and without distended neck veins. Respiratory: Lungs are clear to auscultation. Skin:  Without evidence of edema, or rash. tattooed on right forearm.  Neurologic exam :The patient is awake and alert, oriented to place and time.  Memory subjective  described as intact.  Attention span & concentration ability appears normal.  Speech is fluent,  without  dysarthria, dysphonia or aphasia.  Mood and affect are appropriate.  Cranial nerves: Pupils are equal and briskly reactive to light. Funduscopic exam without  evidence of pallor or edema. Extraocular movements  in vertical and horizontal planes intact and without nystagmus. Visual fields by finger  perimetry are intact.Hearing to finger rub intact. Facial sensation intact to fine touch.Facial motor strength is symmetric and tongue and uvula move midline. Shoulder shrug was symmetrical.  Motor exam:  Normal tone, muscle bulk and symmetric strength in all extremities. Sensory:  Fine touch, pinprick and vibration were tested in all extremities. Proprioception tested in the upper extremities was normal. Coordination: Rapid alternating movements in the fingers/hands was normal. Finger-to-nose maneuver  normal without evidence of ataxia, dysmetria or tremor. Gait and station: The patient reports that she will sometimes drift to one side when walking but this is not associated with falls there is no pain and no limp. Patient walks without assistive device and is able unassisted to climb up to the exam table. Strength within normal limits. Stance is stable and normal.   Deep tendon reflexes: in the  upper and lower extremities are symmetric and intact.    Assessment:  After physical and neurologic examination, review of laboratory studies,  Personal review of imaging studies, reports of other /same  Imaging studies, results of polysomnography and / or neurophysiology testing and pre-existing records as far as provided in visit., my assessment is   1) Mrs. 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 She has had these symptoms for the last 12 years ever since she left high school, they actually affected her in high school. She remembers sleeping a lot in school but not being tested at the time.  She remembered being accused that she may have taken drugs. In order to test her for narcolepsy I will order 2 different tests lines 1 a narcolepsy panel by blood. The second one will be and PSG followed by MS LT. In preparation for an MS LT she cannot be on Chantix or any other medications that suppresses  REM sleep, she could also not be on Vyvanse. For this reason I will push that test out until she feels ready to discontinue these 2 drugs at least for temporary drug  holiday. She would have to be off Chantix, prozac for 3 weeks. She would have to be off Calton Golds for 7 days. The test will be planned for later this summer if convenient. She is also on Prozac and it has the same side effects - suppresses REM sleep.     The patient was advised of the nature of the diagnosed disorder , the treatment options and the  risks for general health and wellness arising from not treating the condition.   I spent more than 50 minutes of face to face time with the patient.  Greater than 50% of time was spent in counseling and coordination of care. We have discussed the diagnosis and differential and I answered the patient's questions.    Plan:  Treatment plan and additional workup : plan for MSLT in July.    Melvyn Novas, MD 07/04/2016, 9:18 AM  Certified in Neurology by ABPN Certified in Sleep Medicine by [facility] [address] [place], [place] [address]

## 2016-07-04 NOTE — Telephone Encounter (Signed)
Pt called me back. She reports that she was unaware of the cost of the HLA test and I advised her that the last estimate I heard, it could be up to $800. Pt said "that's fine" and asked who the bill would come from? I advised her that [facility] would be billing her since that his where the test is performed. Pt is still agreeable to the test being done and her blood being sent.

## 2016-07-04 NOTE — Patient Instructions (Signed)
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 We may have to try different medications that may help you stay awake during the day. Not everything works with everybody the same way. Wake promoting agents include stimulants and non-stimulant type medications. The most common side effects with stimulants are weight loss, insomnia, nervousness, headaches, palpitations, rise in blood pressure, anxiety. Stimulants can be addictive and subject to abuse. Non-stimulant type wake promoting medications include Provigil and Nuvigil, most common side effects include headaches, nervousness, insomnia, hypertension. In addition there is a medication called Xyrem which has been proven to be very effective in patients with narcolepsy with or without cataplexy. Some patients with narcolepsy report episodes of weakness, such as jaw or facial weakness, legs giving out, feeling wobbly or like "Jell-o", etc. in situations of anxiety, stress, laughter, sudden sadness, surprise, etc., which is called cataplexy. You can also experience episodes of sleep paralysis during which you may feel unable to move upon awakening. Some people experience dreamlike sequences upon awakening or upon drifting off to sleep, called hypnopompic or hypnagogic hallucinations.    In order to test her for narcolepsy I will order 2 different tests lines 1 a narcolepsy panel by blood. The second one will be and PSG followed by MS LT. In preparation for an MS LT she cannot be on Chantix or any other medications that suppresses REM sleep, she could also not be on Vyvanse. For this reason I will push that test out until she feels ready to discontinue these 2 drugs at least for temporary drug holiday. She would have to be off Chantix, prozac for 3 weeks. She would have to be off Calton GoldsY [place]ance for 7 days. The  test will be planned for later this summer if convenient. She is also on Prozac and it has the same side effects - suppresses REM sleep.

## 2016-07-04 NOTE — Telephone Encounter (Signed)
I spoke with Dr. Vickey Hugerohmeier and she informed me that the potential cost of this test for an uninsured patient was discussed and that the pt still wanted to proceed with the test. I will send this blood to [facility] per Dr. Oliva Bustardohmeier's request.

## 2016-07-04 NOTE — Telephone Encounter (Signed)
I drew pt's blood per order for pt's HLA test. Draw was successful, no bruising, bleeding noted at site. Pt left in NAD.  I cannot find pt's insurance on file. If pt is self pay, I am concerned that this HLA test will be quite pricey, could be up to $800 was the last estimate given to me.  I called pt to discuss. No answer, left a message asking her to call me back.

## 2016-07-22 NOTE — Telephone Encounter (Signed)
I called pt and left a detailed message on her home/mobile number [phone]), per DPR, advising her that her HLA test came back negative, which is normal, which means that she is not genetically predisposed to having narcolepsy, but should still complete her sleep studies. I asked pt to call me back with any further questions or concerns.

## 2016-08-29 ENCOUNTER — Telehealth: Payer: Self-pay | Admitting: Neurology

## 2016-08-29 NOTE — Telephone Encounter (Signed)
We have attempted to call the patient 3 times to schedule sleep study. Patient has been unavailable at the phone numbers we have on file and has not returned our calls. At this point we will send a letter asking pt to please contact the sleep lab to schedule their sleep study. If patient calls back we will schedule them for their sleep study. ° °

## 2016-10-11 ENCOUNTER — Other Ambulatory Visit: Payer: Self-pay | Admitting: Family

## 2016-10-11 MED ORDER — VARENICLINE TARTRATE 1 MG PO TABS: 1.0000 mg | ORAL_TABLET | Freq: Two times a day (BID) | ORAL | 2 refills | Status: DC

## 2016-10-11 MED ORDER — VARENICLINE TARTRATE 0.5 MG X 11 & 1 MG X 42 PO MISC: ORAL | 0 refills | Status: DC

## 2017-03-12 DIAGNOSIS — J324 Chronic pansinusitis: Secondary | ICD-10-CM | POA: Insufficient documentation

## 2017-03-12 DIAGNOSIS — J3089 Other allergic rhinitis: Secondary | ICD-10-CM | POA: Insufficient documentation

## 2017-12-16 DIAGNOSIS — F900 Attention-deficit hyperactivity disorder, predominantly inattentive type: Secondary | ICD-10-CM | POA: Diagnosis not present

## 2018-01-05 DIAGNOSIS — R82998 Other abnormal findings in urine: Secondary | ICD-10-CM | POA: Diagnosis not present

## 2018-01-05 DIAGNOSIS — Z113 Encounter for screening for infections with a predominantly sexual mode of transmission: Secondary | ICD-10-CM | POA: Diagnosis not present

## 2018-01-05 DIAGNOSIS — Z01419 Encounter for gynecological examination (general) (routine) without abnormal findings: Secondary | ICD-10-CM | POA: Diagnosis not present

## 2018-01-05 DIAGNOSIS — N760 Acute vaginitis: Secondary | ICD-10-CM | POA: Diagnosis not present

## 2018-01-05 DIAGNOSIS — Z6824 Body mass index (BMI) 24.0-24.9, adult: Secondary | ICD-10-CM | POA: Diagnosis not present

## 2018-01-20 DIAGNOSIS — N979 Female infertility, unspecified: Secondary | ICD-10-CM | POA: Diagnosis not present

## 2018-01-20 DIAGNOSIS — F419 Anxiety disorder, unspecified: Secondary | ICD-10-CM | POA: Diagnosis not present

## 2018-01-20 DIAGNOSIS — R5383 Other fatigue: Secondary | ICD-10-CM | POA: Diagnosis not present

## 2018-02-25 ENCOUNTER — Other Ambulatory Visit (HOSPITAL_COMMUNITY): Payer: Self-pay | Admitting: Obstetrics and Gynecology

## 2018-02-25 ENCOUNTER — Other Ambulatory Visit: Payer: Self-pay | Admitting: Obstetrics and Gynecology

## 2018-02-25 DIAGNOSIS — Z3141 Encounter for fertility testing: Secondary | ICD-10-CM

## 2018-03-04 ENCOUNTER — Ambulatory Visit (HOSPITAL_COMMUNITY)
Admission: RE | Admit: 2018-03-04 | Discharge: 2018-03-04 | Disposition: A | Discharge status: Home / Self Care | Payer: BLUE CROSS/BLUE SHIELD | Source: Ambulatory Visit | Attending: Obstetrics and Gynecology | Admitting: Obstetrics and Gynecology

## 2018-03-04 DIAGNOSIS — Z3141 Encounter for fertility testing: Secondary | ICD-10-CM | POA: Diagnosis not present

## 2018-03-04 MED ORDER — IOHEXOL 300 MG/ML  SOLN
10.0000 mL | Freq: Once | INTRAMUSCULAR | Status: AC | PRN
  Administered 2018-03-04: 7 mL via INTRAVENOUS

## 2018-03-10 ENCOUNTER — Ambulatory Visit
Admission: EM | Admit: 2018-03-10 | Discharge: 2018-03-10 | Disposition: A | Discharge status: Home / Self Care | Payer: BLUE CROSS/BLUE SHIELD | Attending: Nurse Practitioner | Admitting: Nurse Practitioner

## 2018-03-10 DIAGNOSIS — J029 Acute pharyngitis, unspecified: Secondary | ICD-10-CM

## 2018-03-10 DIAGNOSIS — R05 Cough: Secondary | ICD-10-CM

## 2018-03-10 DIAGNOSIS — R51 Headache: Secondary | ICD-10-CM

## 2018-03-10 DIAGNOSIS — R0981 Nasal congestion: Secondary | ICD-10-CM | POA: Diagnosis not present

## 2018-03-10 DIAGNOSIS — R509 Fever, unspecified: Secondary | ICD-10-CM | POA: Diagnosis not present

## 2018-03-10 DIAGNOSIS — F900 Attention-deficit hyperactivity disorder, predominantly inattentive type: Secondary | ICD-10-CM | POA: Diagnosis not present

## 2018-03-10 DIAGNOSIS — J3489 Other specified disorders of nose and nasal sinuses: Secondary | ICD-10-CM

## 2018-03-10 DIAGNOSIS — M791 Myalgia, unspecified site: Secondary | ICD-10-CM

## 2018-03-10 DIAGNOSIS — J111 Influenza due to unidentified influenza virus with other respiratory manifestations: Secondary | ICD-10-CM

## 2018-03-10 DIAGNOSIS — R69 Illness, unspecified: Principal | ICD-10-CM

## 2018-03-10 DIAGNOSIS — F17210 Nicotine dependence, cigarettes, uncomplicated: Secondary | ICD-10-CM

## 2018-03-10 LAB — POCT INFLUENZA A/B
INFLUENZA A, POC: NEGATIVE
Influenza B, POC: NEGATIVE

## 2018-03-10 LAB — POCT RAPID STREP A (OFFICE): RAPID STREP A SCREEN: NEGATIVE

## 2018-03-10 MED ORDER — CHLORPHEN-PE-ACETAMINOPHEN 4-10-325 MG PO TABS: 1.0000 | ORAL_TABLET | ORAL | 0 refills | Status: DC | PRN

## 2018-03-10 MED ORDER — IBUPROFEN 600 MG PO TABS: 600.0000 mg | ORAL_TABLET | Freq: Four times a day (QID) | ORAL | 0 refills | Status: DC | PRN

## 2018-03-10 MED ORDER — FLUTICASONE PROPIONATE 50 MCG/ACT NA SUSP: 1.0000 | Freq: Every day | NASAL | 0 refills | Status: DC

## 2018-03-10 MED ORDER — OSELTAMIVIR PHOSPHATE 75 MG PO CAPS: 75.0000 mg | ORAL_CAPSULE | Freq: Two times a day (BID) | ORAL | 0 refills | Status: DC

## 2018-03-10 NOTE — Discharge Instructions (Signed)
Your symptoms are likely due to the flu.  Take medications as prescribed.  Warm salt water gargles to help with your sore throat.  Plenty of fluids.  Get lots of rest.  I'm glad you have decided to stop smoking.Marland Kitchen.Marland Kitchen.Marland Kitchen.Stick to your quit date.  You can do it!!!

## 2018-03-10 NOTE — ED Triage Notes (Signed)
Pt c/o sore throat, headache and body aches since mid morning

## 2018-03-10 NOTE — ED Provider Notes (Signed)
[facility]    CSN: [id number] Arrival date & time: 03/10/18  1856     History   Chief Complaint Chief Complaint  Patient presents with  . Influenza    HPI Brittany Collins is a 28 y.o. female.   Subjective:   Brittany Collins is a 28 y.o. female who presents for evaluation of influenza like symptoms. Symptoms include suspected fevers but not measured at home, chills, headache, myalgias, productive cough, sore throat and fever and have been present for 1 day. She has tried to alleviate the symptoms with ibuprofen and rest with minimal relief. High risk factors for influenza complications: none. She denies any known contacts with strep or flu.  He has not had the flu vaccine this season.  The following portions of the patient's history were reviewed and updated as appropriate: allergies, current medications, past family history, past medical history, past social history, past surgical history and problem list.       Past Medical History:  Diagnosis Date  . Anxiety   . Chicken pox   . GERD (gastroesophageal reflux disease)   . Headache   . Kidney stones   . Migraines     Patient Active Problem List   Diagnosis Date Noted  . Fatigue 06/03/2016  . Cigarette nicotine dependence, uncomplicated 06/03/2016  . Hypersomnolence 06/03/2016    Past Surgical History:  Procedure Laterality Date  . WISDOM TOOTH EXTRACTION      OB History   No obstetric history on file.      Home Medications    Prior to Admission medications   Medication Sig Start Date End Date Taking? Authorizing Provider  Chlorphen-PE-Acetaminophen (NOREL AD) 4-10-325 MG TABS Take 1 tablet by mouth every 4 (four) hours as needed. 03/10/18   Lurline Idol, FNP  FLUoxetine (PROZAC) 10 MG tablet Take 10 mg by mouth daily.    [provider]  fluticasone (FLONASE) 50 MCG/ACT nasal spray Place 1 spray into both nostrils daily. 03/10/18   Lurline Idol, FNP  ibuprofen  (ADVIL,MOTRIN) 600 MG tablet Take 1 tablet (600 mg total) by mouth every 6 (six) hours as needed. 03/10/18   Lurline Idol, FNP  lisdexamfetamine (VYVANSE) 20 MG capsule Take 20 mg by mouth daily.    [provider]  oseltamivir (TAMIFLU) 75 MG capsule Take 1 capsule (75 mg total) by mouth every 12 (twelve) hours. 03/10/18   Lurline Idol, FNP    Family History Family History  Problem Relation Age of Onset  . Arthritis Mother   . Hypertension Mother   . Sleep apnea Mother   . Heart disease Father   . Heart disease Maternal Grandmother   . Diabetes Maternal Grandmother   . Sleep apnea Maternal Grandmother   . Heart disease Maternal Grandfather   . Diabetes Maternal Grandfather   . Stroke Paternal Grandfather     Social History Social History   Tobacco Use  . Smoking status: Current Every Day Smoker    Packs/day: 1.00    Years: 7.00    Pack years: 7.00    Types: Cigarettes  . Smokeless tobacco: Never Used  Substance Use Topics  . Alcohol use: Yes    Comment: <1 per month  . Drug use: No     Allergies   Tramadol and Sulfa antibiotics   Review of Systems Review of Systems  Constitutional: Positive for fever.  HENT: Positive for congestion, rhinorrhea and sore throat.   Respiratory: Positive for cough.   Musculoskeletal: Positive  for myalgias.  Neurological: Positive for headaches.  All other systems reviewed and are negative.    Physical Exam Triage Vital Signs ED Triage Vitals [03/10/18 1913]  Enc Vitals Group     BP 116/80     Pulse Rate (!) 117     Resp 18     Temp 99.9 F (37.7 C)     Temp Source Oral     SpO2 95 %     Weight      Height      Head Circumference      Peak Flow      Pain Score 8     Pain Loc      Pain Edu?      Excl. in GC?    No data found.  Updated Vital Signs BP 116/80 (BP Location: Right Arm)   Pulse (!) 117   Temp 99.9 F (37.7 C) (Oral)   Resp 18   LMP 02/24/2018   SpO2 95%   Visual Acuity Right  Eye Distance:   Left Eye Distance:   Bilateral Distance:    Right Eye Near:   Left Eye Near:    Bilateral Near:     Physical Exam Vitals signs reviewed.  Constitutional:      General: She is not in acute distress.    Appearance: Normal appearance. She is ill-appearing. She is not toxic-appearing or diaphoretic.  HENT:     Head: Normocephalic.     Right Ear: Tympanic membrane, ear canal and external ear normal.     Left Ear: Tympanic membrane, ear canal and external ear normal.     Nose: Nose normal.     Mouth/Throat:     Mouth: Mucous membranes are moist.     Pharynx: Oropharynx is clear.  Eyes:     Extraocular Movements: Extraocular movements intact.     Conjunctiva/sclera: Conjunctivae normal.     Pupils: Pupils are equal, round, and reactive to light.  Neck:     Musculoskeletal: Normal range of motion and neck supple.  Cardiovascular:     Rate and Rhythm: Regular rhythm. Tachycardia present.     Pulses: Normal pulses.     Heart sounds: Normal heart sounds.  Pulmonary:     Effort: Pulmonary effort is normal.     Breath sounds: Normal breath sounds.  Musculoskeletal: Normal range of motion.  Lymphadenopathy:     Cervical: No cervical adenopathy.  Skin:    General: Skin is warm and dry.  Neurological:     General: No focal deficit present.     Mental Status: She is alert and oriented to person, place, and time.  Psychiatric:        Mood and Affect: Mood normal.        Behavior: Behavior normal.      UC Treatments / Results  Labs (all labs ordered are listed, but only abnormal results are displayed) Labs Reviewed  POCT RAPID STREP A (OFFICE)  POCT INFLUENZA A/B    EKG None  Radiology No results found.  Procedures Procedures (including critical care time)  Medications Ordered in UC Medications - No data to display  Initial Impression / Assessment and Plan / UC Course  I have reviewed the triage vital signs and the nursing notes.  Pertinent labs &  imaging results that were available during my care of the patient were reviewed by me and considered in my medical decision making (see chart for details).     28 year old female presenting  with a one-day history of influenza-like symptoms.  Patient is afebrile but looks as though she does not feel well.  Physical exam unremarkable.  Supportive care advised for symptom management. Rapid strep and flu negative.    Plan:  Supportive care with appropriate antipyretics and fluids. Educational material distributed and questions answered. Antivirals per orders. Follow up as needed.  Today's evaluation has revealed no signs of a dangerous process. Discussed diagnosis with patient. Patient aware of their diagnosis, possible red flag symptoms to watch out for and need for close follow up. Patient understands verbal and written discharge instructions. Patient comfortable with plan and disposition.  Patient has a clear mental status at this time, good insight into illness (after discussion and teaching) and has clear judgment to make decisions regarding their care.  Documentation was completed with the aid of voice recognition software. Transcription may contain typographical errors. Final Clinical Impressions(s) / UC Diagnoses   Final diagnoses:  Influenza-like illness     Discharge Instructions     Your symptoms are likely due to the flu.  Take medications as prescribed.  Warm salt water gargles to help with your sore throat.  Plenty of fluids.  Get lots of rest.  I'm glad you have decided to stop smoking.Marland KitchenMarland KitchenMarland KitchenStick to your quit date.  You can do it!!!     ED Prescriptions    Medication Sig Dispense Auth. Provider   oseltamivir (TAMIFLU) 75 MG capsule Take 1 capsule (75 mg total) by mouth every 12 (twelve) hours. 10 capsule Lurline Idol, FNP   Chlorphen-PE-Acetaminophen (NOREL AD) 4-10-325 MG TABS Take 1 tablet by mouth every 4 (four) hours as needed. 21 tablet Iliyah Bui, Lelon Mast, FNP    fluticasone (FLONASE) 50 MCG/ACT nasal spray Place 1 spray into both nostrils daily. 16 g Lurline Idol, FNP   ibuprofen (ADVIL,MOTRIN) 600 MG tablet Take 1 tablet (600 mg total) by mouth every 6 (six) hours as needed. 30 tablet Lurline Idol, FNP     Controlled Substance Prescriptions [place] Controlled Substance Registry consulted? Not Applicable   Lurline Idol, FNP 03/10/18 2000

## 2018-04-15 ENCOUNTER — Telehealth: Payer: BLUE CROSS/BLUE SHIELD | Admitting: Family

## 2018-04-15 DIAGNOSIS — J3489 Other specified disorders of nose and nasal sinuses: Secondary | ICD-10-CM

## 2018-04-15 NOTE — Progress Notes (Signed)
3.0 E-Visit for Corona Virus Screening  Based on what you have shared with me, you need to seek an evaluation for a severe illness that is causing your symptoms which may be coronavirus or some other illness. I recommend that you be seen and evaluated "face to face". Our Emergency Departments are best equipped to handle patients with severe symptoms.   I recommend the following:  . If you are having a true medical emergency please call 911. . If you are considered high risk for Corona virus because of a known exposure, fever, shortness of breath and cough, OR if you have severe symptoms of any kind, seek medical care at an emergency room.  . Please call ahead and tell them that you were seen by telemedicine and they have recommended that you have a face to face evaluation. [facility] Emergency Department [address] [place], [place], [place] [address] [phone]  . [facility] Emergency Department [address] [place], [place] [address] [phone]  . [facility] Emergency Department [address] [place], [place], [place] [address] [phone]  . [facility] Emergency Department [address] [place], [place], [place] [address] [phone]  . [facility] Emergency Department [address] [place], [place], [place] [address] [phone]  NOTE: If you entered your credit card information for this eVisit, you will not be charged. You may see a "hold" on your card for the $35 but that hold will drop off and you will not have a charge processed.   Your e-visit answers were reviewed by a board certified advanced clinical practitioner to complete your personal care plan.  Thank you for using e-Visits.

## 2018-06-12 NOTE — Progress Notes (Signed)
Greater than 5 minutes, yet less than 10 minutes of time have been spent researching, coordinating, and implementing care for this patient today.  Thank you for the details you included in the comment boxes. Those details are very helpful in determining the best course of treatment for you and help us to provide the best care.  

## 2018-07-01 DIAGNOSIS — F900 Attention-deficit hyperactivity disorder, predominantly inattentive type: Secondary | ICD-10-CM | POA: Diagnosis not present

## 2018-07-25 ENCOUNTER — Emergency Department (HOSPITAL_COMMUNITY)
Admission: EM | Admit: 2018-07-25 | Discharge: 2018-07-25 | Disposition: A | Discharge status: Home / Self Care | Payer: BC Managed Care – PPO | Attending: Emergency Medicine | Admitting: Emergency Medicine

## 2018-07-25 ENCOUNTER — Emergency Department (HOSPITAL_COMMUNITY): Payer: BC Managed Care – PPO

## 2018-07-25 ENCOUNTER — Encounter (HOSPITAL_COMMUNITY): Payer: Self-pay | Admitting: Emergency Medicine

## 2018-07-25 ENCOUNTER — Other Ambulatory Visit: Payer: Self-pay

## 2018-07-25 DIAGNOSIS — S51851A Open bite of right forearm, initial encounter: Secondary | ICD-10-CM | POA: Diagnosis not present

## 2018-07-25 DIAGNOSIS — Y929 Unspecified place or not applicable: Secondary | ICD-10-CM | POA: Insufficient documentation

## 2018-07-25 DIAGNOSIS — F17210 Nicotine dependence, cigarettes, uncomplicated: Secondary | ICD-10-CM | POA: Diagnosis not present

## 2018-07-25 DIAGNOSIS — Y998 Other external cause status: Secondary | ICD-10-CM | POA: Diagnosis not present

## 2018-07-25 DIAGNOSIS — W540XXA Bitten by dog, initial encounter: Secondary | ICD-10-CM | POA: Diagnosis not present

## 2018-07-25 DIAGNOSIS — Z79899 Other long term (current) drug therapy: Secondary | ICD-10-CM | POA: Insufficient documentation

## 2018-07-25 DIAGNOSIS — R51 Headache: Secondary | ICD-10-CM | POA: Insufficient documentation

## 2018-07-25 DIAGNOSIS — Y9389 Activity, other specified: Secondary | ICD-10-CM | POA: Diagnosis not present

## 2018-07-25 DIAGNOSIS — Z23 Encounter for immunization: Secondary | ICD-10-CM | POA: Diagnosis not present

## 2018-07-25 DIAGNOSIS — S59911A Unspecified injury of right forearm, initial encounter: Secondary | ICD-10-CM | POA: Diagnosis not present

## 2018-07-25 MED ORDER — TETANUS-DIPHTH-ACELL PERTUSSIS 5-2.5-18.5 LF-MCG/0.5 IM SUSP
0.5000 mL | Freq: Once | INTRAMUSCULAR | Status: AC
  Administered 2018-07-25: 0.5 mL via INTRAMUSCULAR
  Filled 2018-07-25: qty 0.5

## 2018-07-25 MED ORDER — LIDOCAINE-EPINEPHRINE 2 %-1:100000 IJ SOLN
20.0000 mL | Freq: Once | INTRAMUSCULAR | Status: AC
  Administered 2018-07-25: 20 mL via INTRADERMAL
  Filled 2018-07-25: qty 1

## 2018-07-25 MED ORDER — AMOXICILLIN-POT CLAVULANATE 875-125 MG PO TABS: 1.0000 | ORAL_TABLET | Freq: Two times a day (BID) | ORAL | 0 refills | Status: AC

## 2018-07-25 MED ORDER — ACETAMINOPHEN 500 MG PO TABS
1000.0000 mg | ORAL_TABLET | Freq: Once | ORAL | Status: AC
  Administered 2018-07-25: 1000 mg via ORAL
  Filled 2018-07-25: qty 2

## 2018-07-25 NOTE — ED Notes (Signed)
Pt's wound dressed with non-adherent bandages, wrapped in gauze and coban.

## 2018-07-25 NOTE — ED Triage Notes (Signed)
Patient reports trying to break up her dogs fighting. Laceration and puncture wounds to right arm. Laceration approximately three inches. Bleeding controlled. States dog's shots are up to date. Unknown last tetanus.

## 2018-07-25 NOTE — ED Notes (Signed)
pts lac to right forearm rinsed with normal saline and lightly covered with gauze.  Pt lac is moderate in depth with adipose tissue exposed.

## 2018-07-25 NOTE — Discharge Instructions (Addendum)
You were given a prescription for antibiotics. Please take the antibiotic prescription fully.   Please follow-up for suture removal at either urgent care ,the emergency department, or your primary care doctor in 7-10 days.  Please return to the emergency room immediately if you experience any new or worsening symptoms or any symptoms that indicate worsening infection such as fevers, increased redness/swelling/pain, warmth, or drainage from the affected area.   

## 2018-07-25 NOTE — ED Provider Notes (Signed)
[facility]-EMERGENCY DEPT Provider Note   CSN: [id number] Arrival date & time: 07/25/18  1200    History   Chief Complaint Chief Complaint  Patient presents with  . Animal Bite    HPI Brittany Collins is a 28 y.o. female.     HPI   Patient is a 28 year old female with a history of anxiety, GERD, nephrolithiasis, migraines, who presents the emergency department today for evaluation of an intermittent headache that occurred prior to arrival.  Patient states that she has an [place] and a [place] Mastiff that were fighting.  She tried to pull them apart and ended up getting bit in the right forearm.  Has some pain to the right forearm that occurred suddenly and has been constant since onset.  It is worse with palpation and movement of her arm.  She denies any decreased range of motion or numbness to the right upper extremity.  Denies any other injuries.  She is not sure when her last Tdap was.  She states that immunizations of her dogs are up-to-date.  Past Medical History:  Diagnosis Date  . Anxiety   . Chicken pox   . GERD (gastroesophageal reflux disease)   . Headache   . Kidney stones   . Migraines     Patient Active Problem List   Diagnosis Date Noted  . Fatigue 06/03/2016  . Cigarette nicotine dependence, uncomplicated 06/03/2016  . Hypersomnolence 06/03/2016    Past Surgical History:  Procedure Laterality Date  . WISDOM TOOTH EXTRACTION       OB History   No obstetric history on file.      Home Medications    Prior to Admission medications   Medication Sig Start Date End Date Taking? Authorizing Provider  Chlorphen-PE-Acetaminophen (NOREL AD) 4-10-325 MG TABS Take 1 tablet by mouth every 4 (four) hours as needed. 03/10/18   Lurline IdolMurrill, Samantha, FNP  FLUoxetine (PROZAC) 10 MG tablet Take 10 mg by mouth daily.    [provider]  fluticasone (FLONASE) 50 MCG/ACT nasal spray Place 1 spray into both nostrils daily. 03/10/18   Lurline IdolMurrill,  Samantha, FNP  ibuprofen (ADVIL,MOTRIN) 600 MG tablet Take 1 tablet (600 mg total) by mouth every 6 (six) hours as needed. 03/10/18   Lurline IdolMurrill, Samantha, FNP  lisdexamfetamine (VYVANSE) 20 MG capsule Take 20 mg by mouth daily.    [provider]  oseltamivir (TAMIFLU) 75 MG capsule Take 1 capsule (75 mg total) by mouth every 12 (twelve) hours. 03/10/18   Lurline IdolMurrill, Samantha, FNP    Family History Family History  Problem Relation Age of Onset  . Arthritis Mother   . Hypertension Mother   . Sleep apnea Mother   . Heart disease Father   . Heart disease Maternal Grandmother   . Diabetes Maternal Grandmother   . Sleep apnea Maternal Grandmother   . Heart disease Maternal Grandfather   . Diabetes Maternal Grandfather   . Stroke Paternal Grandfather     Social History Social History   Tobacco Use  . Smoking status: Current Every Day Smoker    Packs/day: 1.00    Years: 7.00    Pack years: 7.00    Types: Cigarettes  . Smokeless tobacco: Never Used  Substance Use Topics  . Alcohol use: Yes    Comment: <1 per month  . Drug use: No     Allergies   Tramadol and Sulfa antibiotics   Review of Systems Review of Systems  Musculoskeletal:  RUE pain  Skin: Positive for wound.  Neurological: Negative for weakness and numbness.  All other systems reviewed and are negative.    Physical Exam Updated Vital Signs BP 124/78 (BP Location: Left Arm)   Pulse 99   Temp 98.3 F (36.8 C) (Oral)   Resp 18   LMP 07/11/2018 (Approximate)   SpO2 100%   Physical Exam Vitals signs and nursing note reviewed.  Constitutional:      General: She is not in acute distress.    Appearance: She is well-developed.  HENT:     Head: Normocephalic and atraumatic.  Eyes:     Conjunctiva/sclera: Conjunctivae normal.  Neck:     Musculoskeletal: Neck supple.  Cardiovascular:     Rate and Rhythm: Normal rate.  Pulmonary:     Effort: Pulmonary effort is normal.  Musculoskeletal: Normal  range of motion.     Comments: 3cm linear laceration to the RUE with exposure subcutaneous tissue. 3 smaller (<1cm) puncture wounds to the right forearm. No significant bony tenderness. No active bleeding. Radial/ulnar pulses intact. Brisk cap refill to all fingers. FROM of the RUE. Normal sensation. Compartments are soft.  Skin:    General: Skin is warm and dry.  Neurological:     Mental Status: She is alert.          ED Treatments / Results  Labs (all labs ordered are listed, but only abnormal results are displayed) Labs Reviewed - No data to display  EKG None  Radiology No results found.  Procedures .Marland Kitchen.Laceration Repair  Date/Time: 07/25/2018 3:30 PM Performed by: Karrie Meresouture, Kevon Tench S, PA-C Authorized by: Karrie Meresouture, Yaquelin Langelier S, PA-C   Consent:    Consent obtained:  Verbal   Consent given by:  Patient   Risks discussed:  Infection, pain, need for additional repair and poor cosmetic result   Alternatives discussed:  No treatment Anesthesia (see MAR for exact dosages):    Anesthesia method:  Local infiltration   Local anesthetic:  Lidocaine 2% WITH epi Laceration details:    Location:  Shoulder/arm   Shoulder/arm location:  R lower arm   Length (cm):  3 Repair type:    Repair type:  Simple Pre-procedure details:    Preparation:  Patient was prepped and draped in usual sterile fashion and imaging obtained to evaluate for foreign bodies Exploration:    Hemostasis achieved with:  Epinephrine   Wound exploration: wound explored through full range of motion and entire depth of wound probed and visualized     Wound extent: no foreign bodies/material noted, no muscle damage noted, no nerve damage noted, no tendon damage noted, no underlying fracture noted and no vascular damage noted     Contaminated: yes   Treatment:    Area cleansed with:  Saline   Amount of cleaning:  Extensive   Irrigation solution:  Sterile saline   Irrigation volume:  1L   Irrigation method:  Pressure  wash   Visualized foreign bodies/material removed: no   Skin repair:    Repair method:  Sutures   Suture size:  5-0   Suture material:  Prolene   Number of sutures:  3 Approximation:    Approximation:  Loose Post-procedure details:    Dressing:  Non-adherent dressing and tube gauze   Patient tolerance of procedure:  Tolerated well, no immediate complications   (including critical care time)  Medications Ordered in ED Medications  Tdap (BOOSTRIX) injection 0.5 mL (0.5 mLs Intramuscular Given 07/25/18 1240)  lidocaine-EPINEPHrine (XYLOCAINE W/EPI) 2 %-  1:100000 (with pres) injection 20 mL (20 mLs Intradermal Given by Other 07/25/18 1244)     Initial Impression / Assessment and Plan / ED Course  I have reviewed the triage vital signs and the nursing notes.  Pertinent labs & imaging results that were available during my care of the patient were reviewed by me and considered in my medical decision making (see chart for details).   Final Clinical Impressions(s) / ED Diagnoses   Final diagnoses:  Dog bite, initial encounter   Patient presents with laceration from a dog bite. Wounds examined with visualization of the base and no foreign bodies seen.  Pt Alert and oriented, NAD, nontoxic, nonseptic appearing.  Capillary refill intact and pt without neurologic deficit.  Right forearm x-rays with no evidence of fracture or foreign body noted. Patient tetanus updated.  Patient rabies vaccine and immunoglobulin risk and benefit discussed. Her dogs immunizations are UTD therefor she declines this. Pain treated in the emergency department. Largest wound was closed with sutures loosely due to it being a gaping wound. All wounds were thoroughly irrigated in the department. We'll discharge home with pain medication, Augmentin and requests for close follow-up with PCP or back in the ER.    ED Discharge Orders    None       Bishop Dublin 07/25/18 [place], Ankit, MD 07/27/18  [phone]

## 2018-09-30 DIAGNOSIS — F900 Attention-deficit hyperactivity disorder, predominantly inattentive type: Secondary | ICD-10-CM | POA: Diagnosis not present

## 2018-12-30 DIAGNOSIS — F900 Attention-deficit hyperactivity disorder, predominantly inattentive type: Secondary | ICD-10-CM | POA: Diagnosis not present

## 2019-03-03 DIAGNOSIS — F900 Attention-deficit hyperactivity disorder, predominantly inattentive type: Secondary | ICD-10-CM | POA: Diagnosis not present

## 2019-04-29 ENCOUNTER — Encounter: Payer: Self-pay | Admitting: Internal Medicine

## 2019-05-10 ENCOUNTER — Encounter: Payer: Self-pay | Admitting: Internal Medicine

## 2019-05-10 ENCOUNTER — Telehealth (INDEPENDENT_AMBULATORY_CARE_PROVIDER_SITE_OTHER): Payer: BC Managed Care – PPO | Admitting: Internal Medicine

## 2019-05-10 DIAGNOSIS — R5383 Other fatigue: Secondary | ICD-10-CM

## 2019-05-10 DIAGNOSIS — Z7689 Persons encountering health services in other specified circumstances: Secondary | ICD-10-CM | POA: Diagnosis not present

## 2019-05-10 DIAGNOSIS — F988 Other specified behavioral and emotional disorders with onset usually occurring in childhood and adolescence: Secondary | ICD-10-CM | POA: Insufficient documentation

## 2019-05-10 NOTE — Progress Notes (Signed)
Virtual Visit via Telephone Note  I connected with Brittany Collins, on 05/10/2019 at 3:42 PM by telephone due to the COVID-19 pandemic and verified that I am speaking with the correct person using two identifiers.   Consent: I discussed the limitations, risks, security and privacy concerns of performing an evaluation and management service by telephone and the availability of in person appointments. I also discussed with the patient that there may be a patient responsible charge related to this service. The patient expressed understanding and agreed to proceed.   Location of Patient: Home   Location of Provider: Home    Persons participating in Telemedicine visit: Brittany Collins [facility] Dr. Earlene Plater      History of Present Illness: Patient has a visit to establish care.  Patient has concerns about dealing with a lot of fatigue. Goes through periods of having extreme exhaustion. She reports a couple years ago she was sent to have a sleep study done. She went to consultation but then lost insurance so she never went through with the study. She reports she does not feel well rested when she wakes up. She does not believe she snores at night. Her husband has sleep apnea and she doesn't feel like she snores like he does. She has weekly headaches and will experience them upon wakening. No prior diagnosis of asthma or COPD. She has been a smoker in the past, she quit about 9 months ago.    Past Medical History:  Diagnosis Date  . ADHD   . Allergic rhinitis   . Anxiety   . Chicken pox   . Chronic pansinusitis   . GERD (gastroesophageal reflux disease)   . Headache   . History of kidney stones   . Migraines    Allergies  Allergen Reactions  . Tramadol Other (See Comments)    headache migraines   . Sulfa Antibiotics Other (See Comments)    Reaction unknown  . Sulfamethoxazole     Other reaction(s): Confusion (intolerance)    Current Outpatient Medications on  File Prior to Visit  Medication Sig Dispense Refill  . FLUoxetine (PROZAC) 10 MG capsule Take 10 mg by mouth daily.    Marland Kitchen VYVANSE 30 MG capsule Take 30 mg by mouth at bedtime.     No current facility-administered medications on file prior to visit.    Observations/Objective: NAD. Speaking clearly.  Work of breathing normal.  Alert and oriented. Mood appropriate.   Assessment and Plan: 1. Encounter to establish care  2. Other fatigue Will refer to pulmonology to evaluate for sleep study looking for disturbance that would cause extreme fatigue such as OSA or narcolepsy. Will plan to check labs at upcoming annual exam that could compound fatigue.  - Ambulatory referral to Pulmonology  Follow Up Instructions: 4/27 for annual exam and PAP    I discussed the assessment and treatment plan with the patient. The patient was provided an opportunity to ask questions and all were answered. The patient agreed with the plan and demonstrated an understanding of the instructions.   The patient was advised to call back or seek an in-person evaluation if the symptoms worsen or if the condition fails to improve as anticipated.     I provided 14 minutes total of non-face-to-face time during this encounter including median intraservice time, reviewing previous notes, investigations, ordering medications, medical decision making, coordinating care and patient verbalized understanding at the end of the visit.    Marcy Siren, D.O. Primary Care at  [facility] [facility]  05/10/2019, 3:42 PM

## 2019-05-24 ENCOUNTER — Telehealth: Payer: Self-pay

## 2019-05-24 NOTE — Patient Instructions (Signed)

## 2019-05-24 NOTE — Telephone Encounter (Signed)
Called patient to do their pre-visit COVID screening.  Call went to voicemail, which isn't set up. Unable do prescreening.

## 2019-05-25 ENCOUNTER — Encounter: Payer: Self-pay | Admitting: Internal Medicine

## 2019-05-25 ENCOUNTER — Ambulatory Visit (INDEPENDENT_AMBULATORY_CARE_PROVIDER_SITE_OTHER): Payer: BC Managed Care – PPO | Admitting: Internal Medicine

## 2019-05-25 ENCOUNTER — Other Ambulatory Visit (HOSPITAL_COMMUNITY)
Admission: RE | Admit: 2019-05-25 | Discharge: 2019-05-25 | Disposition: A | Discharge status: Home / Self Care | Payer: BC Managed Care – PPO | Source: Ambulatory Visit | Attending: Internal Medicine | Admitting: Internal Medicine

## 2019-05-25 ENCOUNTER — Other Ambulatory Visit: Payer: Self-pay

## 2019-05-25 VITALS — BP 114/71 | HR 69 | Temp 97.5°F | Resp 17 | Ht 64.0 in | Wt 146.0 lb

## 2019-05-25 DIAGNOSIS — Z114 Encounter for screening for human immunodeficiency virus [HIV]: Secondary | ICD-10-CM | POA: Diagnosis not present

## 2019-05-25 DIAGNOSIS — Z0000 Encounter for general adult medical examination without abnormal findings: Secondary | ICD-10-CM

## 2019-05-25 DIAGNOSIS — Z124 Encounter for screening for malignant neoplasm of cervix: Secondary | ICD-10-CM | POA: Diagnosis not present

## 2019-05-25 DIAGNOSIS — R5383 Other fatigue: Secondary | ICD-10-CM | POA: Diagnosis not present

## 2019-05-25 DIAGNOSIS — Z8742 Personal history of other diseases of the female genital tract: Secondary | ICD-10-CM

## 2019-05-25 NOTE — Progress Notes (Signed)
Subjective:    Brittany Collins - 29 y.o. female MRN [id number]  Date of birth: Jun 17, 1990  HPI  Brittany Collins is here for annual exam.  Has been trying to conceive for 2 years. Reports basic testing for both partners that was negative and not concerning. This was done with Dr. Renaldo Fiddler, ObGYN. She had hysterosalpingogram that was normal. Menstrual periods are regular. Home ovulation kits have shown she does ovulate.    Health Maintenance:  Health Maintenance Due  Topic Date Due  . HIV Screening  Never done  . COVID-19 Vaccine (1) Never done  . PAP-Cervical Cytology Screening  08/09/2018  . PAP SMEAR-Modifier  08/09/2018    -  reports that she has quit smoking. Her smoking use included cigarettes. She has a 7.00 pack-year smoking history. She has never used smokeless tobacco. - Review of Systems: Per HPI. - Past Medical History: Patient Active Problem List   Diagnosis Date Noted  . ADD (attention deficit disorder) 05/10/2019  . Chronic pansinusitis 03/12/2017  . Perennial allergic rhinitis 03/12/2017  . Cigarette nicotine dependence, uncomplicated 06/03/2016  . Hypersomnolence 06/03/2016   - Medications: reviewed and updated   Objective:   Physical Exam BP 114/71   Pulse 69   Temp (!) 97.5 F (36.4 C) (Temporal)   Resp 17   Ht 5\' 4"  (1.626 m)   Wt 146 lb (66.2 kg)   LMP 05/19/2019 (Exact Date)   SpO2 98%   BMI 25.06 kg/m  Physical Exam  Constitutional: She is oriented to person, place, and time and well-developed, well-nourished, and in no distress.  HENT:  Head: Normocephalic and atraumatic.  Mouth/Throat: Oropharynx is clear and moist.  Eyes: Pupils are equal, round, and reactive to light. Conjunctivae and EOM are normal.  Neck: No thyromegaly present.  Cardiovascular: Normal rate, regular rhythm, normal heart sounds and intact distal pulses.  No murmur heard. Pulmonary/Chest: Effort normal and breath sounds normal. No respiratory distress. She has no  wheezes.  Abdominal: Soft. Bowel sounds are normal. She exhibits no distension. There is no abdominal tenderness. There is no rebound and no guarding.  Genitourinary:    Genitourinary Comments: GU/GYN: Exam performed in the presence of a chaperone. External genitalia within normal limits.  Vaginal mucosa pink, moist, normal rugae.  Nonfriable cervix without lesions, no discharge or bleeding noted on speculum exam.  Bimanual exam revealed normal, nongravid uterus.  No cervical motion tenderness. No adnexal masses bilaterally.     Musculoskeletal:        General: No deformity or edema. Normal range of motion.     Cervical back: Normal range of motion and neck supple.  Lymphadenopathy:    She has no cervical adenopathy.  Neurological: She is alert and oriented to person, place, and time. Gait normal.  Skin: Skin is warm and dry. No rash noted. She is not diaphoretic.  Psychiatric: Mood, affect and judgment normal.           Assessment & Plan:   1. Annual physical exam - CBC with Differential - Comprehensive metabolic panel - Lipid Panel  2. Pap smear for cervical cancer screening - Cytology - PAP([place])  3. Other fatigue Referral for sleep study was placed at last visit.  - CBC with Differential - TSH - VITAMIN D 25 Hydroxy (Vit-D Deficiency, Fractures) - Vitamin B12  4. History of infertility Patient has had a fairly significant work up already. Low utility of repeating further laboratory testing. Given trouble conceiving >2 years, would  benefit from referral to reproductive specialist.  - Ambulatory referral to Endocrinology (Infertility)  5. Screening for HIV (human immunodeficiency virus) - HIV antibody (with reflex)      Phill Myron, D.O. 05/25/2019, 10:22 AM Primary Care at [facility]

## 2019-05-26 LAB — CBC WITH DIFFERENTIAL/PLATELET
Basophils Absolute: 0 10*3/uL (ref 0.0–0.2)
Basos: 1 %
EOS (ABSOLUTE): 0.2 10*3/uL (ref 0.0–0.4)
Eos: 3 %
Hematocrit: 36.3 % (ref 34.0–46.6)
Hemoglobin: 12.1 g/dL (ref 11.1–15.9)
Immature Grans (Abs): 0 10*3/uL (ref 0.0–0.1)
Immature Granulocytes: 0 %
Lymphocytes Absolute: 1.5 10*3/uL (ref 0.7–3.1)
Lymphs: 25 %
MCH: 29.2 pg (ref 26.6–33.0)
MCHC: 33.3 g/dL (ref 31.5–35.7)
MCV: 88 fL (ref 79–97)
Monocytes Absolute: 0.4 10*3/uL (ref 0.1–0.9)
Monocytes: 7 %
Neutrophils Absolute: 3.9 10*3/uL (ref 1.4–7.0)
Neutrophils: 64 %
Platelets: 290 10*3/uL (ref 150–450)
RBC: 4.14 x10E6/uL (ref 3.77–5.28)
RDW: 12.6 % (ref 11.7–15.4)
WBC: 6 10*3/uL (ref 3.4–10.8)

## 2019-05-26 LAB — COMPREHENSIVE METABOLIC PANEL
ALT: 15 IU/L (ref 0–32)
AST: 18 IU/L (ref 0–40)
Albumin/Globulin Ratio: 1.7 (ref 1.2–2.2)
Albumin: 4.4 g/dL (ref 3.9–5.0)
Alkaline Phosphatase: 65 IU/L (ref 39–117)
BUN/Creatinine Ratio: 15 (ref 9–23)
BUN: 12 mg/dL (ref 6–20)
Bilirubin Total: 0.2 mg/dL (ref 0.0–1.2)
CO2: 23 mmol/L (ref 20–29)
Calcium: 9.2 mg/dL (ref 8.7–10.2)
Chloride: 105 mmol/L (ref 96–106)
Creatinine, Ser: 0.78 mg/dL (ref 0.57–1.00)
GFR calc Af Amer: 119 mL/min/{1.73_m2} (ref >59)
GFR calc non Af Amer: 103 mL/min/{1.73_m2} (ref >59)
Globulin, Total: 2.6 g/dL (ref 1.5–4.5)
Glucose: 84 mg/dL (ref 65–99)
Potassium: 4.1 mmol/L (ref 3.5–5.2)
Sodium: 140 mmol/L (ref 134–144)
Total Protein: 7 g/dL (ref 6.0–8.5)

## 2019-05-26 LAB — LIPID PANEL
Chol/HDL Ratio: 2.3 ratio (ref 0.0–4.4)
Cholesterol, Total: 155 mg/dL (ref 100–199)
HDL: 67 mg/dL (ref >39)
LDL Chol Calc (NIH): 78 mg/dL (ref 0–99)
Triglycerides: 43 mg/dL (ref 0–149)
VLDL Cholesterol Cal: 10 mg/dL (ref 5–40)

## 2019-05-26 LAB — VITAMIN B12: Vitamin B-12: 897 pg/mL (ref 232–1245)

## 2019-05-26 LAB — VITAMIN D 25 HYDROXY (VIT D DEFICIENCY, FRACTURES): Vit D, 25-Hydroxy: 29.8 ng/mL — ABNORMAL LOW (ref 30.0–100.0)

## 2019-05-26 LAB — TSH: TSH: 0.58 u[IU]/mL (ref 0.450–4.500)

## 2019-05-26 LAB — HIV ANTIBODY (ROUTINE TESTING W REFLEX): HIV Screen 4th Generation wRfx: NONREACTIVE

## 2019-05-27 LAB — CYTOLOGY - PAP: Diagnosis: NEGATIVE

## 2019-05-27 NOTE — Progress Notes (Signed)
Patient notified of results & recommendations. Expressed understanding.

## 2019-07-15 ENCOUNTER — Institutional Professional Consult (permissible substitution): Payer: BC Managed Care – PPO | Admitting: Critical Care Medicine

## 2019-07-21 DIAGNOSIS — F900 Attention-deficit hyperactivity disorder, predominantly inattentive type: Secondary | ICD-10-CM | POA: Diagnosis not present

## 2019-07-25 ENCOUNTER — Ambulatory Visit
Admission: EM | Admit: 2019-07-25 | Discharge: 2019-07-25 | Disposition: A | Discharge status: Home / Self Care | Payer: BC Managed Care – PPO

## 2019-07-25 ENCOUNTER — Ambulatory Visit: Payer: Self-pay

## 2019-07-25 ENCOUNTER — Other Ambulatory Visit: Payer: Self-pay

## 2019-07-25 DIAGNOSIS — R0981 Nasal congestion: Secondary | ICD-10-CM

## 2019-07-25 DIAGNOSIS — J3489 Other specified disorders of nose and nasal sinuses: Secondary | ICD-10-CM | POA: Diagnosis not present

## 2019-07-25 NOTE — Discharge Instructions (Signed)
Flonase daily and add nasal saline spray after. Can take over the counter zyrtec, humidifier. Tylenol/motrin for fever and pain. Monitor for any worsening of symptoms, chest pain, shortness of breath, wheezing, swelling of the throat, go to the emergency department for further evaluation needed.

## 2019-07-25 NOTE — ED Triage Notes (Signed)
Patient presents with sinus congestion that started out as a sore throat x 5 days ago.  Sore throat has subsided. She denies cough, fever or other associated symptoms.

## 2019-07-25 NOTE — ED Provider Notes (Signed)
[facility]    CSN: [id number] Arrival date & time: 07/25/19  1303      History   Chief Complaint Chief Complaint  Patient presents with  . Facial Pain  . Nasal Congestion    HPI Brittany Collins is a 29 y.o. female.   29 year old female comes in for 5 day of URI symptoms. Sore throat, nasal congestion, sinus pressure. Occasional sneezing. Denies cough. Denies fever, chills, body aches. Denies abdominal pain, nausea, vomiting, diarrhea. Denies shortness of breath, loss of taste/smell. Former smoker.      Past Medical History:  Diagnosis Date  . ADHD   . Allergic rhinitis   . Anxiety   . Chicken pox   . Chronic pansinusitis   . GERD (gastroesophageal reflux disease)   . Headache   . History of kidney stones   . Migraines     Patient Active Problem List   Diagnosis Date Noted  . ADD (attention deficit disorder) 05/10/2019  . Chronic pansinusitis 03/12/2017  . Perennial allergic rhinitis 03/12/2017  . Cigarette nicotine dependence, uncomplicated 06/03/2016  . Hypersomnolence 06/03/2016    Past Surgical History:  Procedure Laterality Date  . WISDOM TOOTH EXTRACTION      OB History   No obstetric history on file.      Home Medications    Prior to Admission medications   Medication Sig Start Date End Date Taking? Authorizing Provider  FLUoxetine (PROZAC) 10 MG capsule Take 10 mg by mouth daily. 03/27/19   [provider]  VYVANSE 30 MG capsule Take 30 mg by mouth at bedtime. 04/30/19   [provider]    Family History Family History  Problem Relation Age of Onset  . Arthritis Mother   . Hypertension Mother   . Sleep apnea Mother   . Heart disease Father   . Heart disease Maternal Grandmother   . Diabetes Maternal Grandmother   . Sleep apnea Maternal Grandmother   . Heart disease Maternal Grandfather   . Diabetes Maternal Grandfather   . Stroke Paternal Grandfather     Social History Social History   Tobacco  Use  . Smoking status: Former Smoker    Packs/day: 1.00    Years: 7.00    Pack years: 7.00    Types: Cigarettes  . Smokeless tobacco: Never Used  Vaping Use  . Vaping Use: Never used  Substance Use Topics  . Alcohol use: Yes    Comment: <1 per month  . Drug use: No     Allergies   Tramadol, Sulfa antibiotics, and Sulfamethoxazole   Review of Systems Review of Systems  Reason unable to perform ROS: See HPI as above.     Physical Exam Triage Vital Signs ED Triage Vitals [07/25/19 1314]  Enc Vitals Group     BP 126/84     Pulse Rate (!) 107     Resp 14     Temp 98.3 F (36.8 C)     Temp src      SpO2      Weight      Height      Head Circumference      Peak Flow      Pain Score 4     Pain Loc      Pain Edu?      Excl. in GC?    No data found.  Updated Vital Signs BP 126/84 (BP Location: Left Arm)   Pulse (!) 107   Temp 98.3  F (36.8 C)   Resp 14   LMP 07/12/2019   SpO2 96%   Visual Acuity Right Eye Distance:   Left Eye Distance:   Bilateral Distance:    Right Eye Near:   Left Eye Near:    Bilateral Near:     Physical Exam Constitutional:      General: She is not in acute distress.    Appearance: Normal appearance. She is well-developed. She is not ill-appearing, toxic-appearing or diaphoretic.  HENT:     Head: Normocephalic and atraumatic.     Right Ear: Tympanic membrane, ear canal and external ear normal. Tympanic membrane is not erythematous or bulging.     Left Ear: Tympanic membrane, ear canal and external ear normal. Tympanic membrane is not erythematous or bulging.     Nose:     Right Sinus: Maxillary sinus tenderness present. No frontal sinus tenderness.     Left Sinus: Maxillary sinus tenderness present. No frontal sinus tenderness.     Mouth/Throat:     Mouth: Mucous membranes are moist.     Pharynx: Oropharynx is clear. Uvula midline.  Eyes:     Conjunctiva/sclera: Conjunctivae normal.     Pupils: Pupils are equal, round, and  reactive to light.  Cardiovascular:     Rate and Rhythm: Normal rate and regular rhythm.  Pulmonary:     Effort: Pulmonary effort is normal. No accessory muscle usage, prolonged expiration, respiratory distress or retractions.     Breath sounds: No decreased air movement or transmitted upper airway sounds. No decreased breath sounds.     Comments: LCTAB Musculoskeletal:     Cervical back: Normal range of motion and neck supple.  Skin:    General: Skin is warm and dry.  Neurological:     Mental Status: She is alert and oriented to person, place, and time.      UC Treatments / Results  Labs (all labs ordered are listed, but only abnormal results are displayed) Labs Reviewed - No data to display  EKG   Radiology No results found.  Procedures Procedures (including critical care time)  Medications Ordered in UC Medications - No data to display  Initial Impression / Assessment and Plan / UC Course  I have reviewed the triage vital signs and the nursing notes.  Pertinent labs & imaging results that were available during my care of the patient were reviewed by me and considered in my medical decision making (see chart for details).    Discussed no signs of bacterial infection. Viral illness vs allergic rhinitis. Patient declined COVID testing. Symptomatic treatment discussed. Return precautions given.  If patient with continued/worsening sinus pressure, can call in prednisone 50mg  QD x 5 days, D#5, R#0.  Final Clinical Impressions(s) / UC Diagnoses   Final diagnoses:  Sinus pressure  Nasal congestion    ED Prescriptions    None     PDMP not reviewed this encounter.   Ok Edwards, PA-C 07/25/19 1337

## 2019-08-26 ENCOUNTER — Institutional Professional Consult (permissible substitution): Payer: BC Managed Care – PPO | Admitting: Pulmonary Disease

## 2019-09-15 ENCOUNTER — Ambulatory Visit (INDEPENDENT_AMBULATORY_CARE_PROVIDER_SITE_OTHER): Payer: BC Managed Care – PPO | Admitting: Pulmonary Disease

## 2019-09-15 ENCOUNTER — Ambulatory Visit (INDEPENDENT_AMBULATORY_CARE_PROVIDER_SITE_OTHER): Payer: BC Managed Care – PPO

## 2019-09-15 ENCOUNTER — Encounter: Payer: Self-pay | Admitting: Pulmonary Disease

## 2019-09-15 ENCOUNTER — Other Ambulatory Visit: Payer: Self-pay

## 2019-09-15 VITALS — BP 120/78 | HR 80 | Temp 98.0°F | Ht 64.0 in | Wt 152.6 lb

## 2019-09-15 DIAGNOSIS — G4733 Obstructive sleep apnea (adult) (pediatric): Secondary | ICD-10-CM

## 2019-09-15 DIAGNOSIS — R0602 Shortness of breath: Secondary | ICD-10-CM | POA: Diagnosis not present

## 2019-09-15 DIAGNOSIS — G4710 Hypersomnia, unspecified: Secondary | ICD-10-CM

## 2019-09-15 NOTE — Patient Instructions (Signed)
Excessive daytime sleepiness/exertion Cough  2-week sleep log If possible any medications that are psychoactive should be held for 1 to 2 weeks prior to sleep study which will include Prozac and Vyvanse--needs to be done safely  Sleep study with MSLT  Order chest x-ray  Order pulmonary function tests  Follow-up in 6 weeks  Call with significant concerns

## 2019-09-15 NOTE — Progress Notes (Signed)
Brittany Collins    [id number]    03-31-1990  Primary Care Physician:Wallace, Kandee Keen, DO  Referring Physician: Arvilla Market, DO [address] [place],  [place] [address]  Chief complaint:   Excessive daytime sleepiness  HPI:  Patient with a background history of ADHD and depression She feels much better being on Vyvanse recently  Symptoms are variable with having very severe excessive daytime sleepiness at times that may last about a week or 2 and then may improve for a while Along with excessive daytime sleepiness is extreme exhaustion She does have occasional headaches Usually goes to bed between 10 and 11 PM, takes about 5 minutes to fall asleep May wake up about once or twice to go to the restroom Final wake up time between 630 and 7:30 in the morning  Weight is up about 20 pounds recently  No history of snoring No history of witnessed apneas  Denies a history of cataplexy Did have issues with hallucinations, sleep paralysis prior to being on Vyvanse   She admits to a cough and hacking at work 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   VYVANSE 30 MG capsule Take 30 mg by mouth at bedtime.   No facility-administered encounter medications on file as of 09/15/2019.    Allergies as of 09/15/2019 - Review Complete 09/15/2019  Allergen Reaction Noted   Tramadol Other (See Comments) 03/15/2012   Sulfa antibiotics Other (See Comments) 02/12/2011   Sulfamethoxazole  03/12/2017    Past Medical History:  Diagnosis Date   ADHD    Allergic rhinitis    Anxiety    Chicken pox    Chronic pansinusitis    GERD (gastroesophageal reflux disease)    Headache    History of kidney stones    Migraines     Past Surgical History:  Procedure  Laterality Date   WISDOM TOOTH EXTRACTION      Family History  Problem Relation Age of Onset   Arthritis Mother    Hypertension Mother    Sleep apnea Mother    Heart disease Father    Heart disease Maternal Grandmother    Diabetes Maternal Grandmother    Sleep apnea Maternal Grandmother    Heart disease Maternal Grandfather    Diabetes Maternal Grandfather    Stroke Paternal Grandfather     Social History   Socioeconomic History   Marital status: Married    Spouse name: Not on file   Number of children: 0   Years of education: 14   Highest education level: Not on file  Occupational History   Occupation: Groomer  Tobacco Use   Smoking status: Former Smoker    Packs/day: 1.00    Years: 7.00    Pack years: 7.00    Types: Cigarettes   Smokeless tobacco: Never Used  Building services engineer Use: Never used  Substance and Sexual Activity   Alcohol use: Yes    Comment: <1 per month   Drug use: No   Sexual activity: Yes    Birth control/protection: None  Other Topics Concern   Not on file  Social History Narrative   Fun/Hobby: Play music   Denies abuse and feels safe at home.    Social  Determinants of Health   Financial Resource Strain:    Difficulty of Paying Living Expenses:   Food Insecurity:    Worried About Programme researcher, broadcasting/film/video in the Last Year:    Barista in the Last Year:   Transportation Needs:    Freight forwarder (Medical):    Lack of Transportation (Non-Medical):   Physical Activity:    Days of Exercise per Week:    Minutes of Exercise per Session:   Stress:    Feeling of Stress :   Social Connections:    Frequency of Communication with Friends and Family:    Frequency of Social Gatherings with Friends and Family:    Attends Religious Services:    Active Member of Clubs or Organizations:    Attends Engineer, structural:    Marital Status:   Intimate Partner Violence:    Fear of Current or  Ex-Partner:    Emotionally Abused:    Physically Abused:    Sexually Abused:     Review of Systems  Constitutional: Positive for fatigue.  HENT: Negative.   Respiratory: Positive for cough. Negative for apnea.   Cardiovascular: Negative for chest pain and leg swelling.  Psychiatric/Behavioral: Positive for sleep disturbance.    Vitals:   09/15/19 0948  BP: 120/78  Pulse: 80  Temp: 98 F (36.7 C)  SpO2: 97%     Physical Exam Constitutional:      Appearance: Normal appearance.  HENT:     Head: Normocephalic.     Nose: No congestion.     Mouth/Throat:     Mouth: Mucous membranes are moist.     Pharynx: No oropharyngeal exudate.  Eyes:     Pupils: Pupils are equal, round, and reactive to light.  Cardiovascular:     Rate and Rhythm: Normal rate.     Pulses: Normal pulses.     Heart sounds: Normal heart sounds. No murmur heard.  No friction rub.  Pulmonary:     Effort: Pulmonary effort is normal. No respiratory distress.     Breath sounds: Normal breath sounds. No stridor. No wheezing or rhonchi.  Abdominal:     General: Abdomen is flat.  Musculoskeletal:        General: Normal range of motion.     Cervical back: No rigidity or tenderness.  Skin:    General: Skin is warm.  Neurological:     Mental Status: She is alert.    Results of the Epworth flowsheet 09/15/2019  Sitting and reading 3  Watching TV 2  Sitting, inactive in a public place (e.g. a theatre or a meeting) 1  As a passenger in a car for an hour without a break 2  Lying down to rest in the afternoon when circumstances permit 3  Sitting and talking to someone 1  Sitting quietly after a lunch without alcohol 3  In a car, while stopped for a few minutes in traffic 0  Total score 15    Assessment:  Excessive daytime sleepiness  History of ADHD -On Vyvanse  Cough  Concern for sleep disordered breathing is low as she does not have significant symptoms or history suggesting presence of  significant sleep disordered breathing  her degree of daytime sleepiness is concerning  Plan/Recommendations: We will obtain a chest x-ray and a PFT for a protracted cough   Obtain a sleep study with MSLT -Severe sleepiness -Occasionally significant enough to severely impact activities -Symptoms not consistent with presence of obstructive  sleep apnea   -Obtain a 2-week sleep log -Ideally should be off any psychoactive medications for 1 to 2 weeks prior to sleep study and MSLT  We will follow-up in about 6 weeks  Encouraged to call with any significant concerns     Virl Diamond MD [facility] 09/15/2019, 9:56 AM  CC: Leary Roca*

## 2019-09-16 ENCOUNTER — Telehealth: Payer: Self-pay | Admitting: Pulmonary Disease

## 2019-09-16 DIAGNOSIS — G4733 Obstructive sleep apnea (adult) (pediatric): Secondary | ICD-10-CM

## 2019-09-16 DIAGNOSIS — G4710 Hypersomnia, unspecified: Secondary | ICD-10-CM

## 2019-09-16 NOTE — Telephone Encounter (Signed)
ATC Terri, at sleep lab. LM for her to call back.

## 2019-09-17 NOTE — Telephone Encounter (Signed)
Dr. Val Eagle please advise on patient email:   Good morning,  Following up on my chest X-ray results, but also must clarify that I do not experience shortness of breath, as my clinical data states.  I see my X-ray results say I have Moderate peribronchial thickening, which could suggest bronchitis or asthma (possibly even bronchiectasis?). My excess phlegm has been an issue for about 2 to 2 1/2 years now. Will this result be addressed now or is it best we wait until the scheduled Pulmonary functions test?  Thank you so much and have a great weekend!

## 2019-09-18 NOTE — Telephone Encounter (Signed)
Best to wait and see what PFT shows  Clarification taken, my note and request for the Xray was for coughing

## 2019-09-21 NOTE — Telephone Encounter (Signed)
Dr. Val Eagle, please advise if you are okay with [place] ordering this.

## 2019-09-21 NOTE — Telephone Encounter (Signed)
Order sleep study 

## 2019-09-21 NOTE — Telephone Encounter (Signed)
Kia [phone] pt cant have a MSLT with out a reg sleep study

## 2019-09-21 NOTE — Telephone Encounter (Signed)
Order has been placed for the [facility]. Attempted to call Sleep Lab but unable to reach. Left message for them to return call.

## 2019-09-22 NOTE — Telephone Encounter (Signed)
I talked to Kia at sleep lab & have pt scheduled.  I have left her a vm to call me for appt info.  Nothing further needed for this note.

## 2019-10-19 ENCOUNTER — Encounter (HOSPITAL_BASED_OUTPATIENT_CLINIC_OR_DEPARTMENT_OTHER): Payer: BC Managed Care – PPO | Admitting: Internal Medicine

## 2019-10-19 DIAGNOSIS — Z20822 Contact with and (suspected) exposure to covid-19: Secondary | ICD-10-CM | POA: Diagnosis not present

## 2019-10-19 DIAGNOSIS — Z03818 Encounter for observation for suspected exposure to other biological agents ruled out: Secondary | ICD-10-CM | POA: Diagnosis not present

## 2019-10-20 ENCOUNTER — Encounter (HOSPITAL_BASED_OUTPATIENT_CLINIC_OR_DEPARTMENT_OTHER): Payer: BC Managed Care – PPO | Admitting: Internal Medicine

## 2019-10-21 DIAGNOSIS — F900 Attention-deficit hyperactivity disorder, predominantly inattentive type: Secondary | ICD-10-CM | POA: Diagnosis not present

## 2019-10-23 ENCOUNTER — Other Ambulatory Visit (HOSPITAL_COMMUNITY)
Admission: RE | Admit: 2019-10-23 | Discharge: 2019-10-23 | Disposition: A | Discharge status: Home / Self Care | Payer: BC Managed Care – PPO | Source: Ambulatory Visit | Attending: Pulmonary Disease | Admitting: Pulmonary Disease

## 2019-10-23 DIAGNOSIS — Z20822 Contact with and (suspected) exposure to covid-19: Secondary | ICD-10-CM | POA: Diagnosis not present

## 2019-10-23 DIAGNOSIS — Z01812 Encounter for preprocedural laboratory examination: Secondary | ICD-10-CM | POA: Diagnosis not present

## 2019-10-23 LAB — SARS CORONAVIRUS 2 (TAT 6-24 HRS): SARS Coronavirus 2: NEGATIVE

## 2019-10-27 ENCOUNTER — Ambulatory Visit (INDEPENDENT_AMBULATORY_CARE_PROVIDER_SITE_OTHER): Payer: BC Managed Care – PPO | Admitting: Pulmonary Disease

## 2019-10-27 ENCOUNTER — Encounter: Payer: Self-pay | Admitting: Pulmonary Disease

## 2019-10-27 ENCOUNTER — Other Ambulatory Visit: Payer: Self-pay

## 2019-10-27 VITALS — BP 110/70 | HR 90 | Temp 97.3°F | Ht 65.0 in | Wt 151.0 lb

## 2019-10-27 DIAGNOSIS — G4710 Hypersomnia, unspecified: Secondary | ICD-10-CM | POA: Diagnosis not present

## 2019-10-27 DIAGNOSIS — G4733 Obstructive sleep apnea (adult) (pediatric): Secondary | ICD-10-CM

## 2019-10-27 DIAGNOSIS — J3089 Other allergic rhinitis: Secondary | ICD-10-CM

## 2019-10-27 DIAGNOSIS — K219 Gastro-esophageal reflux disease without esophagitis: Secondary | ICD-10-CM | POA: Insufficient documentation

## 2019-10-27 LAB — PULMONARY FUNCTION TEST
DL/VA % pred: 112 %
DL/VA: 5.14 ml/min/mmHg/L
DLCO cor % pred: 120 %
DLCO cor: 27.97 ml/min/mmHg
DLCO unc % pred: 120 %
DLCO unc: 27.97 ml/min/mmHg
FEF 25-75 Post: 4.37 L/sec
FEF 25-75 Pre: 3.02 L/sec
FEF2575-%Change-Post: 44 %
FEF2575-%Pred-Post: 124 %
FEF2575-%Pred-Pre: 85 %
FEV1-%Change-Post: 13 %
FEV1-%Pred-Post: 111 %
FEV1-%Pred-Pre: 98 %
FEV1-Post: 3.66 L
FEV1-Pre: 3.23 L
FEV1FVC-%Change-Post: 4 %
FEV1FVC-%Pred-Pre: 94 %
FEV6-%Change-Post: 7 %
FEV6-%Pred-Post: 113 %
FEV6-%Pred-Pre: 104 %
FEV6-Post: 4.38 L
FEV6-Pre: 4.06 L
FEV6FVC-%Pred-Post: 100 %
FEV6FVC-%Pred-Pre: 100 %
FVC-%Change-Post: 7 %
FVC-%Pred-Post: 112 %
FVC-%Pred-Pre: 104 %
FVC-Post: 4.38 L
FVC-Pre: 4.06 L
Post FEV1/FVC ratio: 84 %
Post FEV6/FVC ratio: 100 %
Pre FEV1/FVC ratio: 80 %
Pre FEV6/FVC Ratio: 100 %
RV % pred: 114 %
RV: 1.65 L
TLC % pred: 102 %
TLC: 5.32 L

## 2019-10-27 MED ORDER — PANTOPRAZOLE SODIUM 40 MG PO TBEC: 40.0000 mg | DELAYED_RELEASE_TABLET | Freq: Every day | ORAL | 3 refills | Status: DC

## 2019-10-27 NOTE — Progress Notes (Signed)
Full PFT performed today. °

## 2019-10-27 NOTE — Patient Instructions (Addendum)
You were seen today by Coral Ceo, NP  for:   1. Perennial allergic rhinitis  Please start taking a daily antihistamine:  >>>choose one of: zyrtec, claritin, allegra, or xyzal  >>>these are over the counter medications  >>>can choose generic option  >>>take daily  >>>this medication helps with allergies, post nasal drip, and cough   Start nasal saline rinses twice daily Use distilled water Shake well Get bottle lukewarm like a baby bottle   2. Gastroesophageal reflux disease, unspecified whether esophagitis present  - pantoprazole (PROTONIX) 40 MG tablet; Take 1 tablet (40 mg total) by mouth daily.  Dispense: 30 tablet; Refill: 3  Protonix 40 mg tablet  >>>Please take 1 tablet daily 15 minutes to 30 minutes before your first meal of the day as well as before your other medications >>>Try to take at the same time each day >>>take this medication daily  GERD management: >>>Avoid laying flat until 2 hours after meals >>>Elevate head of the bed including entire chest >>>Reduce size of meals and amount of fat, acid, spices, caffeine and sweets >>>If you are smoking, Please stop! >>>Decrease alcohol consumption >>>Work on maintaining a healthy weight with normal BMI     3. Hypersomnolence  Keep sleep study appts  Nov/2021   We recommend today:   Meds ordered this encounter  Medications   pantoprazole (PROTONIX) 40 MG tablet    Sig: Take 1 tablet (40 mg total) by mouth daily.    Dispense:  30 tablet    Refill:  3    Follow Up:    Return in about 2 months (around 12/27/2019), or if symptoms worsen or fail to improve, for Follow up with Dr. Wynona Neat.   Notification of test results are managed in the following manner: If there are  any recommendations or changes to the  plan of care discussed in office today,  we will contact you and let you know what they are. If you do not hear from [place], then your results are normal and you can view them through your  MyChart account  , or a letter will be sent to you. Thank you again for trusting [place] with your care  - Thank you, [facility]    It is flu season:   >>> Best ways to protect herself from the flu: Receive the yearly flu vaccine, practice good hand hygiene washing with soap and also using hand sanitizer when available, eat a nutritious meals, get adequate rest, hydrate appropriately       Please contact the office if your symptoms worsen or you have concerns that you are not improving.   Thank you for choosing [facility] for your healthcare, and for allowing [place] to partner with you on your healthcare journey. I am thankful to be able to provide care to you today.   Elisha Headland FNP-C    Gastroesophageal Reflux Disease, Adult Gastroesophageal reflux (GER) happens when acid from the stomach flows up into the tube that connects the mouth and the stomach (esophagus). Normally, food travels down the esophagus and stays in the stomach to be digested. With GER, food and stomach acid sometimes move back up into the esophagus. You may have a disease called gastroesophageal reflux disease (GERD) if the reflux:  Happens often.  Causes frequent or very bad symptoms.  Causes problems such as damage to the esophagus. When this happens, the esophagus becomes sore and swollen (inflamed). Over time, GERD can make small holes (ulcers) in the lining  of the esophagus. What are the causes? This condition is caused by a problem with the muscle between the esophagus and the stomach. When this muscle is weak or not normal, it does not close properly to keep food and acid from coming back up from the stomach. The muscle can be weak because of:  Tobacco use.  Pregnancy.  Having a certain type of hernia (hiatal hernia).  Alcohol use.  Certain foods and drinks, such as coffee, chocolate, onions, and peppermint. What increases the risk? You are more likely to develop this condition if you:  Are  overweight.  Have a disease that affects your connective tissue.  Use NSAID medicines. What are the signs or symptoms? Symptoms of this condition include:  Heartburn.  Difficult or painful swallowing.  The feeling of having a lump in the throat.  A bitter taste in the mouth.  Bad breath.  Having a lot of saliva.  Having an upset or bloated stomach.  Belching.  Chest pain. Different conditions can cause chest pain. Make sure you see your doctor if you have chest pain.  Shortness of breath or noisy breathing (wheezing).  Ongoing (chronic) cough or a cough at night.  Wearing away of the surface of teeth (tooth enamel).  Weight loss. How is this treated? Treatment will depend on how bad your symptoms are. Your doctor may suggest:  Changes to your diet.  Medicine.  Surgery. Follow these instructions at home: Eating and drinking   Follow a diet as told by your doctor. You may need to avoid foods and drinks such as: ? Coffee and tea (with or without caffeine). ? Drinks that contain alcohol. ? Energy drinks and sports drinks. ? Bubbly (carbonated) drinks or sodas. ? Chocolate and cocoa. ? Peppermint and mint flavorings. ? Garlic and onions. ? Horseradish. ? Spicy and acidic foods. These include peppers, chili powder, curry powder, vinegar, hot sauces, and BBQ sauce. ? Citrus fruit juices and citrus fruits, such as oranges, lemons, and limes. ? Tomato-based foods. These include red sauce, chili, salsa, and pizza with red sauce. ? Fried and fatty foods. These include donuts, french fries, potato chips, and high-fat dressings. ? High-fat meats. These include hot dogs, rib eye steak, sausage, ham, and bacon. ? High-fat dairy items, such as whole milk, butter, and cream cheese.  Eat small meals often. Avoid eating large meals.  Avoid drinking large amounts of liquid with your meals.  Avoid eating meals during the 2-3 hours before bedtime.  Avoid lying down right  after you eat.  Do not exercise right after you eat. Lifestyle   Do not use any products that contain nicotine or tobacco. These include cigarettes, e-cigarettes, and chewing tobacco. If you need help quitting, ask your doctor.  Try to lower your stress. If you need help doing this, ask your doctor.  If you are overweight, lose an amount of weight that is healthy for you. Ask your doctor about a safe weight loss goal. General instructions  Pay attention to any changes in your symptoms.  Take over-the-counter and prescription medicines only as told by your doctor. Do not take aspirin, ibuprofen, or other NSAIDs unless your doctor says it is okay.  Wear loose clothes. Do not wear anything tight around your waist.  Raise (elevate) the head of your bed about 6 inches (15 cm).  Avoid bending over if this makes your symptoms worse.  Keep all follow-up visits as told by your doctor. This is important. Contact a doctor  if:  You have new symptoms.  You lose weight and you do not know why.  You have trouble swallowing or it hurts to swallow.  You have wheezing or a cough that keeps happening.  Your symptoms do not get better with treatment.  You have a hoarse voice. Get help right away if:  You have pain in your arms, neck, jaw, teeth, or back.  You feel sweaty, dizzy, or light-headed.  You have chest pain or shortness of breath.  You throw up (vomit) and your throw-up looks like blood or coffee grounds.  You pass out (faint).  Your poop (stool) is bloody or black.  You cannot swallow, drink, or eat. Summary  If a person has gastroesophageal reflux disease (GERD), food and stomach acid move back up into the esophagus and cause symptoms or problems such as damage to the esophagus.  Treatment will depend on how bad your symptoms are.  Follow a diet as told by your doctor.  Take all medicines only as told by your doctor. This information is not intended to replace  advice given to you by your health care provider. Make sure you discuss any questions you have with your health care provider. Document Revised: 07/23/2017 Document Reviewed: 07/23/2017 Elsevier Patient Education  2020 [employer]   Food Choices for Gastroesophageal Reflux Disease, Adult When you have gastroesophageal reflux disease (GERD), the foods you eat and your eating habits are very important. Choosing the right foods can help ease your discomfort. Think about working with a nutrition specialist (dietitian) to help you make good choices. What are tips for following this plan?  Meals  Choose healthy foods that are low in fat, such as fruits, vegetables, whole grains, low-fat dairy products, and lean meat, fish, and poultry.  Eat small meals often instead of 3 large meals a day. Eat your meals slowly, and in a place where you are relaxed. Avoid bending over or lying down until 2-3 hours after eating.  Avoid eating meals 2-3 hours before bed.  Avoid drinking a lot of liquid with meals.  Cook foods using methods other than frying. Bake, grill, or broil food instead.  Avoid or limit: ? Chocolate. ? Peppermint or spearmint. ? Alcohol. ? Pepper. ? Black and decaffeinated coffee. ? Black and decaffeinated tea. ? Bubbly (carbonated) soft drinks. ? Caffeinated energy drinks and soft drinks.  Limit high-fat foods such as: ? Fatty meat or fried foods. ? Whole milk, cream, butter, or ice cream. ? Nuts and nut butters. ? Pastries, donuts, and sweets made with butter or shortening.  Avoid foods that cause symptoms. These foods may be different for everyone. Common foods that cause symptoms include: ? Tomatoes. ? Oranges, lemons, and limes. ? Peppers. ? Spicy food. ? Onions and garlic. ? Vinegar. Lifestyle  Maintain a healthy weight. Ask your doctor what weight is healthy for you. If you need to lose weight, work with your doctor to do so safely.  Exercise for at least 30  minutes for 5 or more days each week, or as told by your doctor.  Wear loose-fitting clothes.  Do not smoke. If you need help quitting, ask your doctor.  Sleep with the head of your bed higher than your feet. Use a wedge under the mattress or blocks under the bed frame to raise the head of the bed. Summary  When you have gastroesophageal reflux disease (GERD), food and lifestyle choices are very important in easing your symptoms.  Eat small meals often  instead of 3 large meals a day. Eat your meals slowly, and in a place where you are relaxed.  Limit high-fat foods such as fatty meat or fried foods.  Avoid bending over or lying down until 2-3 hours after eating.  Avoid peppermint and spearmint, caffeine, alcohol, and chocolate. This information is not intended to replace advice given to you by your health care provider. Make sure you discuss any questions you have with your health care provider. Document Revised: 05/07/2018 Document Reviewed: 02/20/2016 Elsevier Patient Education  2020 [employer].

## 2019-10-27 NOTE — Progress Notes (Signed)
@Patient  ID: , female    DOB: [birth year], 29 y.o.   MRN: [id number]  Chief Complaint  Patient presents with  . Follow-up    get pft results.  nothing new    Referring provider: [phone]*  HPI:  29 year old female former smoker followed in our office for hypersomnolence and allergic rhinitis  PMH: GERD, history of sinus infections, ADD Smoker/ Smoking History: Former smoker.  7-pack-year smoker. Maintenance: None Pt of: Dr. 26  10/27/2019  - Visit   29 year old female former smoker completing follow-up with our office today after obtaining pulmonary function testing.  Those results are listed below:  10/27/2019-pulmonary function test-FVC 4.06 (104% predicted), postbronchodilator ratio 84, postbronchodilator FEV1 3.66 (111% predicted), positive bronchodilator response in FEV1, TLC 5.32 (102% predicted), DLCO 27.97 (120% predicted)  Patient reporting she continues to have excessive phlegm and sputum production.  This is mainly clear.  She does occasionally have a cough.  She does not report significant shortness of breath.  She has never been diagnosed with asthma before.  She does have a history of previous sinus infections.  She is not taking a daily antihistamine.  She has persistent daily reflux symptoms.  These are treated with Tums.  She is not on a maintenance therapy for management of acid reflux.  She is currently scheduled November/2021 for a sleep study as well as an ML ST.  She will need follow-up with our office with Dr. 10/29/2019 after completing.   Questionaires / Pulmonary Flowsheets:   Epworth:  Results of the Epworth flowsheet 09/15/2019  Sitting and reading 3  Watching TV 2  Sitting, inactive in a public place (e.g. a theatre or a meeting) 1  As a passenger in a car for an hour without a break 2  Lying down to rest in the afternoon when circumstances permit 3  Sitting and talking to someone 1  Sitting quietly after a lunch without  alcohol 3  In a car, while stopped for a few minutes in traffic 0  Total score 15    Tests:   FENO:  No results found for: NITRICOXIDE  PFT: PFT Results Latest Ref Rng & Units 10/27/2019  FVC-Pre L 4.06  FVC-Predicted Pre % 104  FVC-Post L 4.38  FVC-Predicted Post % 112  Pre FEV1/FVC % % 80  Post FEV1/FCV % % 84  FEV1-Pre L 3.23  FEV1-Predicted Pre % 98  FEV1-Post L 3.66  DLCO uncorrected ml/min/mmHg 27.97  DLCO UNC% % 120  DLCO corrected ml/min/mmHg 27.97  DLCO COR %Predicted % 120  DLVA Predicted % 112  TLC L 5.32  TLC % Predicted % 102  RV % Predicted % 114    WALK:  No flowsheet data found.  Imaging: No results found.  Lab Results:  CBC    Component Value Date/Time   WBC 6.0 05/25/2019 1040   WBC 6.2 06/03/2016 1004   RBC 4.14 05/25/2019 1040   RBC 4.41 06/03/2016 1004   HGB 12.1 05/25/2019 1040   HCT 36.3 05/25/2019 1040   PLT 290 05/25/2019 1040   MCV 88 05/25/2019 1040   MCH 29.2 05/25/2019 1040   MCH 30.7 09/22/2012 1300   MCHC 33.3 05/25/2019 1040   MCHC 33.4 06/03/2016 1004   RDW 12.6 05/25/2019 1040   LYMPHSABS 1.5 05/25/2019 1040   MONOABS 0.6 09/22/2012 1300   EOSABS 0.2 05/25/2019 1040   BASOSABS 0.0 05/25/2019 1040    BMET    Component Value Date/Time  NA 140 05/25/2019 1040   K 4.1 05/25/2019 1040   CL 105 05/25/2019 1040   CO2 23 05/25/2019 1040   GLUCOSE 84 05/25/2019 1040   GLUCOSE 64 (L) 09/22/2012 1300   BUN 12 05/25/2019 1040   CREATININE 0.78 05/25/2019 1040   CALCIUM 9.2 05/25/2019 1040   GFRNONAA 103 05/25/2019 1040   GFRAA 119 05/25/2019 1040    BNP No results found for: BNP  ProBNP No results found for: PROBNP  Specialty Problems      Pulmonary Problems   Chronic pansinusitis   Perennial allergic rhinitis      Allergies  Allergen Reactions  . Tramadol Other (See Comments)    headache migraines   . Sulfa Antibiotics Other (See Comments)    Reaction unknown  . Sulfamethoxazole     Other  reaction(s): Confusion (intolerance)    Immunization History  Administered Date(s) Administered  . Tdap 07/25/2018    Past Medical History:  Diagnosis Date  . ADHD   . Allergic rhinitis   . Anxiety   . Chicken pox   . Chronic pansinusitis   . GERD (gastroesophageal reflux disease)   . Headache   . History of kidney stones   . Migraines     Tobacco History: Social History   Tobacco Use  Smoking Status Former Smoker  . Packs/day: 1.00  . Years: 7.00  . Pack years: 7.00  . Types: Cigarettes  Smokeless Tobacco Never Used   Counseling given: Not Answered   Continue to not smoke  Outpatient Encounter Medications as of 10/27/2019  Medication Sig  . FLUoxetine (PROZAC) 10 MG capsule Take 10 mg by mouth daily.  Marland Kitchen VYVANSE 30 MG capsule Take 30 mg by mouth at bedtime.  . pantoprazole (PROTONIX) 40 MG tablet Take 1 tablet (40 mg total) by mouth daily.   No facility-administered encounter medications on file as of 10/27/2019.     Review of Systems  Review of Systems  Constitutional: Positive for fatigue. Negative for activity change and fever.  HENT: Positive for congestion. Negative for sinus pressure, sinus pain and sore throat.   Respiratory: Positive for cough. Negative for shortness of breath and wheezing.   Cardiovascular: Negative for chest pain and palpitations.  Gastrointestinal: Negative for diarrhea, nausea and vomiting.  Musculoskeletal: Negative for arthralgias.  Neurological: Negative for dizziness.  Psychiatric/Behavioral: Negative for sleep disturbance. The patient is not nervous/anxious.      Physical Exam  BP 110/70 (BP Location: Left Arm, Patient Position: Sitting, Cuff Size: Normal)   Pulse 90   Temp (!) 97.3 F (36.3 C) (Temporal)   Ht 5\' 5"  (1.651 m)   Wt 151 lb (68.5 kg)   SpO2 99%   BMI 25.13 kg/m   Wt Readings from Last 5 Encounters:  10/27/19 151 lb (68.5 kg)  09/15/19 152 lb 9.6 oz (69.2 kg)  05/25/19 146 lb (66.2 kg)  07/04/16  128 lb (58.1 kg)  06/03/16 131 lb 12.8 oz (59.8 kg)    BMI Readings from Last 5 Encounters:  10/27/19 25.13 kg/m  09/15/19 26.19 kg/m  05/25/19 25.06 kg/m  07/04/16 21.97 kg/m  06/03/16 22.62 kg/m     Physical Exam Vitals and nursing note reviewed.  Constitutional:      General: She is not in acute distress.    Appearance: Normal appearance. She is normal weight.  HENT:     Head: Normocephalic and atraumatic.     Right Ear: External ear normal.     Left Ear:  External ear normal.     Nose: Rhinorrhea present. No congestion.     Mouth/Throat:     Mouth: Mucous membranes are moist.     Pharynx: Oropharynx is clear.     Comments: +PND Eyes:     Pupils: Pupils are equal, round, and reactive to light.  Cardiovascular:     Rate and Rhythm: Normal rate and regular rhythm.     Pulses: Normal pulses.     Heart sounds: Normal heart sounds. No murmur heard.   Pulmonary:     Effort: Pulmonary effort is normal. No respiratory distress.     Breath sounds: No decreased air movement. No decreased breath sounds, wheezing or rales.  Musculoskeletal:     Cervical back: Normal range of motion.  Skin:    General: Skin is warm and dry.     Capillary Refill: Capillary refill takes less than 2 seconds.  Neurological:     General: No focal deficit present.     Mental Status: She is alert and oriented to person, place, and time. Mental status is at baseline.     Gait: Gait normal.  Psychiatric:        Mood and Affect: Mood normal.        Behavior: Behavior normal.        Thought Content: Thought content normal.        Judgment: Judgment normal.       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 Val Eagle after completing    Return in about 2 months (around 12/27/2019), or if symptoms worsen or fail to improve, for Follow up with Dr. Wynona Neat.   Coral Ceo, NP 10/27/2019   This appointment required 32 minutes of patient care (this includes precharting, chart review, review of results, face-to-face care, etc.).

## 2019-10-27 NOTE — Assessment & Plan Note (Signed)
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

## 2019-10-27 NOTE — Assessment & Plan Note (Signed)
Plan: Keep scheduled sleep studies in November/2021 Schedule follow-up with Dr. Val Eagle after completing

## 2019-10-27 NOTE — Assessment & Plan Note (Signed)
Daily persistent reflux symptoms Treated with Tums  Plan: Start PPI Reviewed GERD lifestyle recommendations on AVS Review additional AVS information on acid reflux Trial therapy of PPI for 8 to 12 weeks, if symptoms persist despite PPI treatment willing to refer to gastroenterology

## 2019-11-17 ENCOUNTER — Telehealth: Payer: Self-pay | Admitting: Pulmonary Disease

## 2019-11-17 NOTE — Telephone Encounter (Signed)
PA request received for Pantoprazole.  In pt's chart, we have that pt has tried/failed famotidine as well as Tums.  When trying to initiate the PA, preferred meds were esomeprazole, omeprazole, and lansoprazole. Not seeing in pt's chart where she has tried any of these meds.  Attempted to call pt to further discuss this with her prior to going forward with the PA. Unable to reach pt and unable to leave VM due to mailbox being full. Will try to call pt back so we can further discuss this with her.  Will continue with PA once have spoken with pt or will send to provider about alternatives if pt has not tried any of the 3 preferred.

## 2019-11-19 NOTE — Telephone Encounter (Signed)
Pt was called vm was full tried to ask pt if she had tried esomeprazole, omeprazole,lansoprazole.According to pt chart only meds tried were pepcid ,and tums

## 2019-12-13 ENCOUNTER — Other Ambulatory Visit: Payer: Self-pay

## 2019-12-13 ENCOUNTER — Ambulatory Visit (HOSPITAL_BASED_OUTPATIENT_CLINIC_OR_DEPARTMENT_OTHER): Payer: BC Managed Care – PPO | Attending: Pulmonary Disease | Admitting: Internal Medicine

## 2019-12-13 DIAGNOSIS — G4710 Hypersomnia, unspecified: Secondary | ICD-10-CM | POA: Diagnosis not present

## 2019-12-13 DIAGNOSIS — G4733 Obstructive sleep apnea (adult) (pediatric): Secondary | ICD-10-CM

## 2019-12-14 ENCOUNTER — Ambulatory Visit (HOSPITAL_BASED_OUTPATIENT_CLINIC_OR_DEPARTMENT_OTHER): Payer: BC Managed Care – PPO | Attending: Pulmonary Disease | Admitting: Internal Medicine

## 2019-12-14 DIAGNOSIS — G4733 Obstructive sleep apnea (adult) (pediatric): Secondary | ICD-10-CM | POA: Diagnosis not present

## 2019-12-14 DIAGNOSIS — G4710 Hypersomnia, unspecified: Secondary | ICD-10-CM

## 2019-12-14 DIAGNOSIS — G4711 Idiopathic hypersomnia with long sleep time: Secondary | ICD-10-CM | POA: Diagnosis not present

## 2019-12-15 ENCOUNTER — Other Ambulatory Visit: Payer: Self-pay

## 2019-12-15 DIAGNOSIS — G4710 Hypersomnia, unspecified: Secondary | ICD-10-CM | POA: Diagnosis not present

## 2019-12-28 ENCOUNTER — Telehealth: Payer: Self-pay | Admitting: Pulmonary Disease

## 2019-12-28 ENCOUNTER — Encounter (HOSPITAL_BASED_OUTPATIENT_CLINIC_OR_DEPARTMENT_OTHER): Payer: Self-pay | Admitting: Internal Medicine

## 2019-12-28 NOTE — Telephone Encounter (Signed)
Call patient  Sleep study result  Date of study: 12/14/2019  Impression: MSLT significant for pathologic sleepiness, idiopathic hypersomnolence  Recommendation: Follow-up for idiopathic hypersomnolence Treatment is usually with stimulants

## 2019-12-28 NOTE — Procedures (Signed)
POLYSOMNOGRAPHY  Last, FirstMarcelia, Collins MRN: [id number] Gender: Female Age (years): 29 Weight (lbs): 150 DOB: 11-21-1990 BMI: 26 Primary Care: No PCP Epworth Score: 16 Referring: Tomma Lightning MD Technician: Rosette Reveal Interpreting: Tomma Lightning MD Study Type: NPSG Ordered Study Type: NPSG Study date: 12/13/2019 Location: [place] CLINICAL INFORMATION Brittany Collins is a 29 year old Female and was referred to the sleep center for evaluation of G47.33 OSA: Adult and Pediatric (327.23). Indications include N/A.  MEDICATIONS Patient self administered medications include: N/A. Medications administered during study include No sleep medicine administered.  SLEEP STUDY TECHNIQUE A multi-channel overnight Polysomnography study was performed. The channels recorded and monitored were central and occipital EEG, electrooculogram (EOG), submentalis EMG (chin), nasal and oral airflow, thoracic and abdominal wall motion, anterior tibialis EMG, snore microphone, electrocardiogram, and a pulse oximetry. TECHNICIAN COMMENTS Comments added by Technician: None Comments added by Scorer: N/A SLEEP ARCHITECTURE The study was initiated at 10:36:54 PM and terminated at 6:00:10 AM. The total recorded time was 443.3 minutes. EEG confirmed total sleep time was 302.5 minutes yielding a sleep efficiency of 68.2%%. Sleep onset after lights out was 22.3 minutes with a REM latency of 93.5 minutes. The patient spent 6.0%% of the night in stage N1 sleep, 69.8%% in stage N2 sleep, 12.2%% in stage N3 and 12.1% in REM. Wake after sleep onset (WASO) was 118.5 minutes. The Arousal Index was 1.4/hour. RESPIRATORY PARAMETERS There were a total of 0 respiratory disturbances out of which 0 were apneas ( 0 obstructive, 0 mixed, 0 central) and 0 hypopneas. The apnea/hypopnea index (AHI) was 0.0 events/hour. The central sleep apnea index was 0.0 events/hour. The REM AHI was 0.0 events/hour and NREM AHI was 0.0  events/hour. The supine AHI was 0.0 events/hour and the non supine AHI was 0 supine during 2.64% of sleep. Respiratory disturbances were associated with oxygen desaturation down to a nadir of 93.0% during sleep. The mean oxygen saturation during the study was 96.0%. The cumulative time under 88% oxygen saturation was 5.5 minutes.  LEG MOVEMENT DATA The total leg movements were 0 with a resulting leg movement index of 0.0/hr .Associated arousal with leg movement index was 0.0/hr.  CARDIAC DATA The underlying cardiac rhythm was most consistent with sinus rhythm. Mean heart rate during sleep was 78.2 bpm. Additional rhythm abnormalities include None.   IMPRESSIONS - No Significant Obstructive Sleep apnea(OSA) - EKG showed no cardiac abnormalities. - No significant Oxygen Desaturation - No snoring was audible during this study. - No significant periodic leg movements(PLMs) during sleep. However, no significant associated arousals.   DIAGNOSIS - Negative study for significant obstructive sleep apnea - No significant oxygen saturation's   RECOMMENDATIONS - Proceed with MSLT - Avoid alcohol, sedatives and other CNS depressants that may worsen sleep apnea and disrupt normal sleep architecture. - Sleep hygiene should be reviewed to assess factors that may improve sleep quality. - Weight management and regular exercise should be initiated or continued.  [Electronically signed] 12/28/2019 08:16 PM  Virl Diamond MD NPI: [id number]

## 2019-12-28 NOTE — Procedures (Signed)
POLYSOMNOGRAPHY  Last, [name], Brittany Collins MRN: [id number] Gender: Female Age (years): 29 Weight (lbs): 150 DOB: [birth year] BMI: 26 Primary Care: No PCP Epworth Score: 16 Referring: Tomma Lightning MD Technician: [place] Interpreting: Tomma Lightning MD Study Type: MSLT Ordered Study Type: MSLT Study date: 12/14/2019 Location: [place] CLINICAL INFORMATION The patient was referred to the sleep center for evaluation of N/A. Indications include N/A. MEDICATIONS Patient self administered medications include: N/A. Medications administered during study include No sleep medicine administered.  SLEEP STUDY TECHNIQUE A multiple sleep latency test was performed. The channels recorded and monitored were central and occipital EEG, electrooculogram (EOG), submentalis EMG (chin), and electrocardiogram. TECHNICIAN COMMENTS Comments added by Technician: None Comments added by Scorer: N/A   IMPRESSIONS - No sleep onset REMs present. This study does not suggest narcolepsy. - Total number of naps attempted: 5 . 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 - Return for follow up to evaluate other causes of excessive daytime sleepiness.  [Electronically signed] 12/28/2019 08:19 PM  Virl Diamond MD NPI: [id number]

## 2019-12-29 ENCOUNTER — Encounter: Payer: Self-pay | Admitting: *Deleted

## 2019-12-29 NOTE — Telephone Encounter (Signed)
Results have been sent to the pt via email.  Nothing further is needed.

## 2020-01-07 ENCOUNTER — Encounter: Payer: Self-pay | Admitting: Pulmonary Disease

## 2020-01-07 ENCOUNTER — Ambulatory Visit (INDEPENDENT_AMBULATORY_CARE_PROVIDER_SITE_OTHER): Payer: BC Managed Care – PPO | Admitting: Pulmonary Disease

## 2020-01-07 ENCOUNTER — Other Ambulatory Visit: Payer: Self-pay

## 2020-01-07 VITALS — BP 124/70 | HR 97 | Temp 98.6°F | Ht 64.0 in | Wt 155.4 lb

## 2020-01-07 DIAGNOSIS — G4710 Hypersomnia, unspecified: Secondary | ICD-10-CM

## 2020-01-07 NOTE — Patient Instructions (Signed)
Hypersomnolence -Your sleep study did not show evidence to support a diagnosis of narcolepsy -He did confirm that you are very sleepy  -With your current condition, a scheduled nap on a regular basis will help you feel better  Regarding possibility of reflux -Behavioral modifications -Avoiding late meals -Sleeping with the head of the bed elevated may help symptoms  We will see you as needed Call us post delivery for further evaluation and management

## 2020-01-07 NOTE — Progress Notes (Signed)
Brittany Collins    [id number]    08-22-1990  Primary Care Physician:Wallace, Kandee Keen, DO  Referring Physician: Arvilla Market, DO [address] [place],  [place] [address]  Chief complaint:   Excessive daytime sleepiness In for follow-up today  HPI:  Patient with a background history of ADHD and depression She feels much better being on Vyvanse recently  Recently had a sleep study and MSLT -No significant sleep apnea Good quality sleep MSLT consistent with hypersomnolence, does not support a diagnosis of narcolepsy.  She is about 5 to [redacted] weeks pregnant  Symptoms are variable with having very severe excessive daytime sleepiness at times that may last about a week or 2 and then may improve for a while Along with excessive daytime sleepiness is extreme exhaustion She does have occasional headaches Usually goes to bed between 10 and 11 PM, takes about 5 minutes to fall asleep May wake up about once or twice to go to the restroom Final wake up time between 630 and 7:30 in the morning  Weight is up about 20 pounds recently  No history of snoring No history of witnessed apneas  Denies a history of cataplexy Did have issues with hallucinations, sleep paralysis prior to being on Vyvanse   She admits to a cough and hacking at work 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 FLUoxetine (PROZAC) 10 MG capsule Take 10 mg by mouth daily. (Patient not taking: Reported on 01/07/2020)  . pantoprazole (PROTONIX) 40 MG tablet Take 1 tablet (40 mg total) by mouth daily. (Patient not taking: Reported on 01/07/2020)  . VYVANSE 30 MG capsule Take 30 mg by mouth at bedtime. (Patient not taking: Reported on 01/07/2020)   No facility-administered encounter medications on file as of 01/07/2020.    Allergies as of 01/07/2020 - Review  Complete 01/07/2020  Allergen Reaction Noted  . Tramadol Other (See Comments) 03/15/2012  . Sulfa antibiotics Other (See Comments) 02/12/2011  . Sulfamethoxazole  03/12/2017    Past Medical History:  Diagnosis Date  . ADHD   . Allergic rhinitis   . Anxiety   . Chicken pox   . Chronic pansinusitis   . GERD (gastroesophageal reflux disease)   . Headache   . History of kidney stones   . Migraines     Past Surgical History:  Procedure Laterality Date  . WISDOM TOOTH EXTRACTION      Family History  Problem Relation Age of Onset  . Arthritis Mother   . Hypertension Mother   . Sleep apnea Mother   . Heart disease Father   . Heart disease Maternal Grandmother   . Diabetes Maternal Grandmother   . Sleep apnea Maternal Grandmother   . Heart disease Maternal Grandfather   . Diabetes Maternal Grandfather   . Stroke Paternal Grandfather     Social History   Socioeconomic History  . Marital status: Married    Spouse name: Not on file  . Number of children: 0  . Years of education: 69  . Highest education level: Not on file  Occupational History  . Occupation: Groomer  Tobacco Use  . Smoking status: Former Smoker    Packs/day: 1.00    Years: 7.00    Pack years: 7.00    Types: Cigarettes  . Smokeless tobacco: Never Used  Vaping Use  . Vaping Use: Never used  Substance and Sexual Activity  . Alcohol use: Yes    Comment: <1 per month  . Drug use: No  . Sexual activity: Yes    Birth control/protection: None  Other Topics Concern  . Not on file  Social History Narrative   Fun/Hobby: Play music   Denies abuse and feels safe at home.    Social Determinants of Health   Financial Resource Strain: Not on file  Food Insecurity: Not on file  Transportation Needs: Not on file  Physical Activity: Not on file  Stress: Not on file  Social Connections: Not on file  Intimate Partner Violence: Not on file    Review of Systems  Constitutional: Positive for fatigue.   HENT: Negative.   Respiratory: Positive for cough. Negative for apnea.   Cardiovascular: Negative for chest pain and leg swelling.  Psychiatric/Behavioral: Positive for sleep disturbance.    Vitals:   01/07/20 0935  BP: 124/70  Pulse: 97  Temp: 98.6 F (37 C)  SpO2: 98%     Physical Exam Constitutional:      Appearance: Normal appearance.  HENT:     Head: Normocephalic.     Mouth/Throat:     Mouth: Mucous membranes are moist.     Pharynx: No oropharyngeal exudate.  Eyes:     Pupils: Pupils are equal, round, and reactive to light.  Cardiovascular:     Rate and Rhythm: Normal rate and regular rhythm.     Pulses: Normal pulses.     Heart sounds: Normal heart sounds. No murmur heard. No friction rub.  Pulmonary:     Effort: Pulmonary effort is normal. No respiratory distress.     Breath sounds: Normal breath sounds. No stridor. No wheezing or rhonchi.  Musculoskeletal:     Cervical back: No rigidity or tenderness.  Neurological:     Mental Status: She is alert.  Psychiatric:        Mood and Affect: Mood normal.    Results of the Epworth flowsheet 09/15/2019  Sitting and reading 3  Watching TV 2  Sitting, inactive in a public place (e.g. a theatre or a meeting) 1  As a passenger in a car for an hour without a break 2  Lying down to rest in the afternoon when circumstances permit 3  Sitting and talking to someone 1  Sitting quietly after a lunch without alcohol 3  In a car, while stopped for a few minutes in traffic 0  Total score 15    Assessment:  Excessive daytime sleepiness  Idiopathic hypersomnolence  History of ADHD -was on Vyvanse  Cough -Concern for reflux  Early pregnancy  Plan/Recommendations:  Monitoring  Scheduled naps may be of benefit  Not a candidate for stimulants with pregnancy  Encouraged to call with any significant concerns  We will see as needed  Further work-up as needed following pregnancy   Virl Diamond  MD [facility] 01/07/2020, 9:41 AM  CC: Leary Roca*

## 2020-01-12 DIAGNOSIS — F900 Attention-deficit hyperactivity disorder, predominantly inattentive type: Secondary | ICD-10-CM | POA: Diagnosis not present

## 2020-01-18 DIAGNOSIS — N911 Secondary amenorrhea: Secondary | ICD-10-CM | POA: Diagnosis not present

## 2020-01-21 IMAGING — DX RIGHT FOREARM - 2 VIEW
2 series · 2 of 2 positions shown · non-contrast
Comparison: None.

CLINICAL DATA: 27-year-old female status post dog bite to the right
forearm.

EXAM:
RIGHT FOREARM - 2 VIEW

[forearm ap]
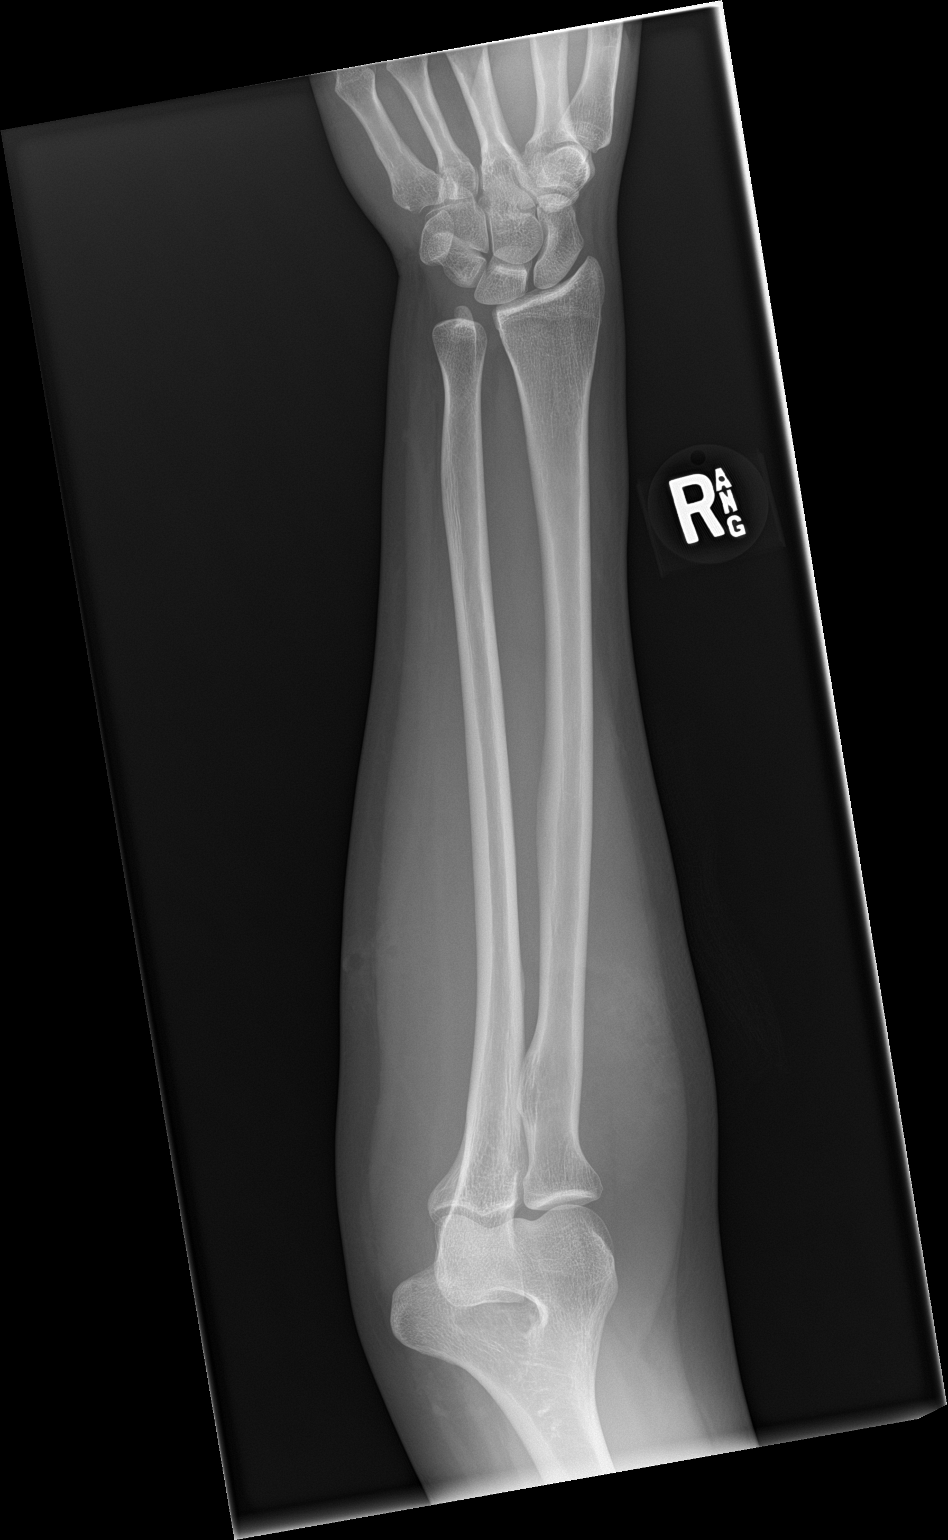

[forearm lat]
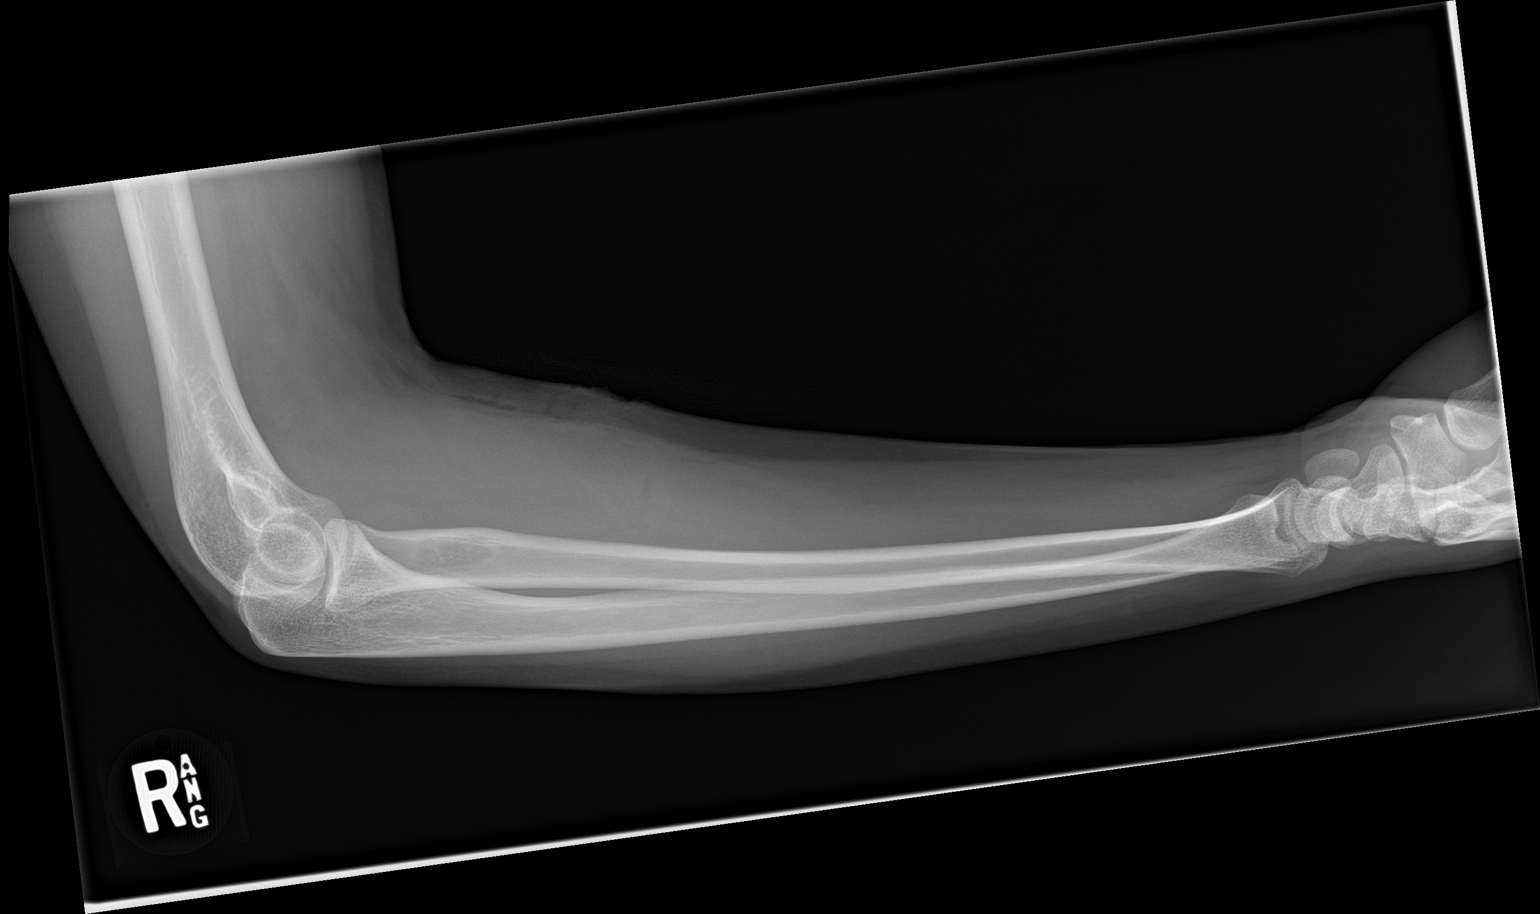

[2 of 2 positions shown; findings below may reference images not displayed]

FINDINGS: Small soft tissue irregularities over the proximal forearm, volar
surface consistent with the clinical history of dog bite. No
evidence of underlying fracture or retained radiopaque foreign body.
Bones and joints are unremarkable.
IMPRESSION: Soft tissue injury overlying the volar upper forearm without
evidence of underlying fracture or retained radiopaque foreign body.

## 2020-01-26 DIAGNOSIS — Z31430 Encounter of female for testing for genetic disease carrier status for procreative management: Secondary | ICD-10-CM | POA: Diagnosis not present

## 2020-01-26 DIAGNOSIS — Z3480 Encounter for supervision of other normal pregnancy, unspecified trimester: Secondary | ICD-10-CM | POA: Diagnosis not present

## 2020-01-26 DIAGNOSIS — Z3685 Encounter for antenatal screening for Streptococcus B: Secondary | ICD-10-CM | POA: Diagnosis not present

## 2020-01-26 DIAGNOSIS — Z3481 Encounter for supervision of other normal pregnancy, first trimester: Secondary | ICD-10-CM | POA: Diagnosis not present

## 2020-01-26 LAB — OB RESULTS CONSOLE GC/CHLAMYDIA
Chlamydia: NEGATIVE
Gonorrhea: NEGATIVE

## 2020-01-26 LAB — OB RESULTS CONSOLE RPR: RPR: NONREACTIVE

## 2020-01-26 LAB — OB RESULTS CONSOLE ANTIBODY SCREEN: Antibody Screen: NEGATIVE

## 2020-01-26 LAB — OB RESULTS CONSOLE ABO/RH: RH Type: POSITIVE

## 2020-01-26 LAB — OB RESULTS CONSOLE HEPATITIS B SURFACE ANTIGEN: Hepatitis B Surface Ag: NEGATIVE

## 2020-01-26 LAB — OB RESULTS CONSOLE HIV ANTIBODY (ROUTINE TESTING): HIV: NONREACTIVE

## 2020-01-26 LAB — OB RESULTS CONSOLE RUBELLA ANTIBODY, IGM: Rubella: IMMUNE

## 2020-01-31 ENCOUNTER — Other Ambulatory Visit: Payer: Self-pay

## 2020-02-04 ENCOUNTER — Other Ambulatory Visit: Payer: Self-pay

## 2020-02-11 ENCOUNTER — Other Ambulatory Visit: Payer: Self-pay

## 2020-02-11 DIAGNOSIS — Z113 Encounter for screening for infections with a predominantly sexual mode of transmission: Secondary | ICD-10-CM | POA: Diagnosis not present

## 2020-02-11 DIAGNOSIS — Z3A10 10 weeks gestation of pregnancy: Secondary | ICD-10-CM | POA: Diagnosis not present

## 2020-02-11 DIAGNOSIS — Z3401 Encounter for supervision of normal first pregnancy, first trimester: Secondary | ICD-10-CM | POA: Diagnosis not present

## 2020-02-24 DIAGNOSIS — Z3682 Encounter for antenatal screening for nuchal translucency: Secondary | ICD-10-CM | POA: Diagnosis not present

## 2020-02-24 DIAGNOSIS — Z3A12 12 weeks gestation of pregnancy: Secondary | ICD-10-CM | POA: Diagnosis not present

## 2020-02-24 DIAGNOSIS — Z3481 Encounter for supervision of other normal pregnancy, first trimester: Secondary | ICD-10-CM | POA: Diagnosis not present

## 2020-03-06 ENCOUNTER — Other Ambulatory Visit: Payer: Self-pay

## 2020-03-10 ENCOUNTER — Other Ambulatory Visit: Payer: Self-pay

## 2020-03-10 ENCOUNTER — Other Ambulatory Visit: Payer: Self-pay | Admitting: Obstetrics and Gynecology

## 2020-03-10 DIAGNOSIS — Z3682 Encounter for antenatal screening for nuchal translucency: Secondary | ICD-10-CM

## 2020-03-10 DIAGNOSIS — Z363 Encounter for antenatal screening for malformations: Secondary | ICD-10-CM

## 2020-03-13 ENCOUNTER — Ambulatory Visit: Payer: Self-pay

## 2020-03-13 ENCOUNTER — Encounter: Payer: Self-pay | Admitting: *Deleted

## 2020-03-13 ENCOUNTER — Ambulatory Visit (HOSPITAL_BASED_OUTPATIENT_CLINIC_OR_DEPARTMENT_OTHER): Payer: BC Managed Care – PPO | Admitting: Genetic Counselor

## 2020-03-13 ENCOUNTER — Other Ambulatory Visit: Payer: Self-pay | Admitting: Obstetrics and Gynecology

## 2020-03-13 ENCOUNTER — Ambulatory Visit: Payer: BC Managed Care – PPO | Admitting: *Deleted

## 2020-03-13 ENCOUNTER — Ambulatory Visit: Payer: BC Managed Care – PPO | Attending: Obstetrics and Gynecology

## 2020-03-13 ENCOUNTER — Other Ambulatory Visit: Payer: Self-pay

## 2020-03-13 ENCOUNTER — Other Ambulatory Visit: Payer: Self-pay | Admitting: *Deleted

## 2020-03-13 VITALS — BP 115/67 | HR 94 | Wt 155.0 lb

## 2020-03-13 DIAGNOSIS — Z363 Encounter for antenatal screening for malformations: Secondary | ICD-10-CM | POA: Insufficient documentation

## 2020-03-13 DIAGNOSIS — Z3A14 14 weeks gestation of pregnancy: Secondary | ICD-10-CM

## 2020-03-13 DIAGNOSIS — Z3682 Encounter for antenatal screening for nuchal translucency: Secondary | ICD-10-CM

## 2020-03-13 DIAGNOSIS — O99342 Other mental disorders complicating pregnancy, second trimester: Secondary | ICD-10-CM

## 2020-03-13 DIAGNOSIS — O289 Unspecified abnormal findings on antenatal screening of mother: Secondary | ICD-10-CM

## 2020-03-13 DIAGNOSIS — Z315 Encounter for genetic counseling: Secondary | ICD-10-CM | POA: Insufficient documentation

## 2020-03-13 DIAGNOSIS — F418 Other specified anxiety disorders: Secondary | ICD-10-CM

## 2020-03-13 DIAGNOSIS — O285 Abnormal chromosomal and genetic finding on antenatal screening of mother: Secondary | ICD-10-CM

## 2020-03-13 DIAGNOSIS — O280 Abnormal hematological finding on antenatal screening of mother: Secondary | ICD-10-CM | POA: Diagnosis not present

## 2020-03-13 DIAGNOSIS — N83209 Unspecified ovarian cyst, unspecified side: Secondary | ICD-10-CM

## 2020-03-13 DIAGNOSIS — O3482 Maternal care for other abnormalities of pelvic organs, second trimester: Secondary | ICD-10-CM

## 2020-03-13 NOTE — Progress Notes (Signed)
03/13/2020  Orlene Och August 05, 1990 MRN: [id number] DOV: 03/13/2020  Ms. Vetsch presented to the [facility] for a genetics consultation regarding her noninvasive prenatal screening (NIPS) results that were high-risk for Klinefelter syndrome. Ms. Johannsen was accompanied to her appointment by her husband, Franceen Erisman.   Indication for genetic counseling - NIPS high-risk for 47,XXY (Klinefelter syndrome)  Prenatal history  Ms. Witkop is a G64P0, 30 y.o. female. Her current pregnancy has completed [redacted]w[redacted]d (Estimated Date of Delivery: 09/05/20).  Ms. Selmer denied exposure to environmental toxins or chemical agents. She denied the use of alcohol, tobacco or street drugs.She reported taking prenatal vitamins. She denied significant viral illnesses, fevers, and bleeding during the course of her pregnancy. Her medical and surgical histories were noncontributory.  Family History  A three generation pedigree was drafted and reviewed. Family history information was collected by [facility] genetic counseling student Malachi Paradise under my supervision. The family history is remarkable for the following:  - Mr. Mell brother has identical twin boys, one of whom had hydrocephalus. These twins were reportedly born at term. Hydrocephalus is a condition where excess cerebrospinal fluid accumulates in the brain. Hydrocephalus causes widening of the ventricles in the brain that increases pressure on the brain tissue itself. Hydrocephalus can be caused by a wide variety of factors, including prematurity, health conditions such as spina bifida, and some genetic conditions. Without knowing the etiology of this individual's hydrocephalus, risk of recurrence is unknown.  The remaining family histories were reviewed and found to be noncontributory for birth defects, intellectual disability, recurrent pregnancy loss, and known genetic conditions.    The patient's ancestry is [place]. The father of  the pregnancy's ancestry is [place], [place], and [place]. Ashkenazi Jewish ancestry and consanguinity were denied. Pedigree will be scanned under Media.  Discussion  NIPS result:  Ms. Propps was referred for genetic counseling as she had [facility] noninvasive prenatal screening (NIPS) that came back high-risk for Klinefelter syndrome (47,XXY) in the current pregnancy.   Klinefelter syndrome:   We discussed that Klinefelter syndrome is a sex chromosome aneuploidy in which an affected female has two X chromosomes and one Y chromosome instead of the expected one X chromosome and one Y chromosome. Klinefelter syndrome can impact the physical and intellectual development of affected males. Males with Klinefelter syndrome produce decreased amounts of testosterone, leading to delayed or incomplete puberty, breast enlargement (gynecomastia), decreased muscle mass, decreased bone density, and a reduced amount of facial and body hair. Individuals typically have small testes and may have additional differences in their genitalia. They also are often taller than average. Possible impacts on intellectual development include learning disabilities in speech and reading, social or behavioral problems, anxiety, depression, and autism. Other possible complications include an increased risk to develop type 2 diabetes, hypertension, female breast cancer, and autoimmune conditions such as lupus or rheumatoid arthritis. Infertility is present in 95-99% of affected males. Individuals with Klinefelter syndrome may be treated with testosterone replacement therapy at the beginning of puberty and undergo assisted reproductive technologies such as testicular sperm extraction with intracytoplasmic sperm injection (TESE-ICSI) to increase their chances of fathering biological children.    We discussed that symptoms of Klinefelter syndrome are often mild and differ among affected individuals. It is thought that approximately 75% of males with  Klinefelter syndrome are never diagnosed. Many other individuals are not diagnosed until puberty or until experiencing infertility later in adulthood. It has also been suggested that individuals who are diagnosed with  Klinefelter syndrome prenatally may be less severely affected than those that are diagnosed postnatally given that this is most often an incidental finding in the prenatal period.   We also reviewed that Klinefelter syndrome is rarely inherited, and instead tends to randomly occur as a result of improper segregation of chromosomal material during the formation of sex cells in a process called nondisjunction. Klinefelter syndrome and other chromosomal aneuploidies are associated with advanced maternal age. This is because nondisjunction occurs more frequently as a female ages.   We reviewed that NIPS analyzes cell free DNA originating from the placenta that is found in the maternal blood circulation during pregnancy. This test can provide information regarding the presence or absence of extra fetal DNA for chromosomes 13, 18 and 21 as well as the sex chromosomes. The reported detection rate is 73% for XXY. NIPS cannot be considered diagnostic for chromosome conditions. Positive predictive value (PPV) is the probability that a pregnancy with a positive test result is truly affected. The PPV reported by the NIPS laboratory for Klinefelter syndrome in the current pregnancy was estimated to be 86.4%. The PPV calculator offered by the [employer] and the [employer] estimated PPV for Klinefelter syndrome in the current pregnancy to be 31%. Thus, there is a ~30-80% chance that this could be a true positive result.   Ms. Choung was counseled that there are several possible explanations for her high risk NIPS result. Firstly, the fetus could truly be affected by Klinefelter syndrome. Secondly, the fetus could be mosaic for Klinefelter syndrome, though this is  rare. Mosaicism occurs when an individual has two or more genetically different sets of cells in their body. Individuals who are mosaic for Klinefelter syndrome have some cells in the body with 47,XXY chromosomes and have other cells that are chromosomally normal or have a different abnormal chromosomal complement. We discussed that it would not be possible to tell which features an individual with mosaic Klinefelter syndrome may have, as it is impossible to assess which specific cells and tissues in the body have the extra chromosome. Thirdly, the placenta could have Klinefelter syndrome while the fetus could be unaffected. This is a phenomenon known as confined placental mosaicism (CPM). Lastly, this could be a false positive result that resulted due to normal variation.   Ultrasound:    A limited first trimester ultrasound was performed today prior to our visit. The ultrasound report will be sent under separate cover. There were no visualized fetal anomalies or markers suggestive of aneuploidy at this point in pregnancy. The nuchal translucency measurement was normal. We discussed that many fetuses with Klinefelter syndrome do not demonstrate signs of the condition on ultrasound.  Diagnostic testing:  Ms. Fuerte was also counseled regarding diagnostic testing via amniocentesis available from 16 weeks' gestation. We discussed the technical aspects of the procedure and quoted up to a 1 in 500 (0.2%) risk for spontaneous pregnancy loss or other adverse pregnancy outcomes as a result of amniocentesis. Cultured cells from an amniocentesis sample allow for the visualization of a fetal karyotype, which can detect >99% of large chromosomal aberrations. Chromosomal microarray can also be performed to identify smaller deletions or duplications of fetal chromosomal material. Ms. Pardy was counseled that amniocentesis is the only way to determine if a fetus has Klinefelter syndrome prenatally. However, the features  that a fetus diagnosed with Klinefelter syndrome may experience postnatally often cannot be predicted in the prenatal period. Ms. Farkas indicated that she is not  interested in undergoing diagnostic testing during the pregnancy. She informed me that she would not terminate the pregnancy on the basis of a diagnosis of Klinefelter syndrome and would prefer to wait until the postnatal period to pursue further genetic testing.  Plan:  Additional screening and diagnostic testing were declined today. Ms. Ringel will return to Maternal Fetal Medicine in four weeks for a detailed anatomy scan. She understands that screening tests, including ultrasound, cannot rule out all birth defects or genetic syndromes.   Although Ms. Burbage presented to today's appointment with a thorough understanding of Klinefelter syndrome, she requested additional resources focused on treatment and management options for individuals with the condition. I will send these resources to her via email.  I counseled Ms. Lovering regarding the above risks and available options. The approximate face-to-face time with the genetic counselor was 40 minutes.  In summary:  Discussed NIPS result  NIPS high-risk for 47,XXY (Klinefelter syndrome) with PPV ~30-80%  Reviewed results of ultrasound  No fetal anomalies or markers seen at this early gestational age  Offered additional testing and screening  Not interested in pursuing diagnostic testing  Interested in postnatal testing to determine if baby has Klinefelter syndrome  Reviewed family history concerns   Gershon Crane, MS, Aeronautical engineer

## 2020-03-29 DIAGNOSIS — F605 Obsessive-compulsive personality disorder: Secondary | ICD-10-CM | POA: Diagnosis not present

## 2020-03-29 DIAGNOSIS — F900 Attention-deficit hyperactivity disorder, predominantly inattentive type: Secondary | ICD-10-CM | POA: Diagnosis not present

## 2020-04-10 ENCOUNTER — Ambulatory Visit: Payer: BC Managed Care – PPO

## 2020-04-10 ENCOUNTER — Other Ambulatory Visit: Payer: BC Managed Care – PPO

## 2020-04-11 DIAGNOSIS — Z3A19 19 weeks gestation of pregnancy: Secondary | ICD-10-CM | POA: Diagnosis not present

## 2020-04-11 DIAGNOSIS — Z363 Encounter for antenatal screening for malformations: Secondary | ICD-10-CM | POA: Diagnosis not present

## 2020-06-15 DIAGNOSIS — Z3480 Encounter for supervision of other normal pregnancy, unspecified trimester: Secondary | ICD-10-CM | POA: Diagnosis not present

## 2020-06-29 DIAGNOSIS — Z23 Encounter for immunization: Secondary | ICD-10-CM | POA: Diagnosis not present

## 2020-07-27 DIAGNOSIS — O26843 Uterine size-date discrepancy, third trimester: Secondary | ICD-10-CM | POA: Diagnosis not present

## 2020-07-27 DIAGNOSIS — Z3A34 34 weeks gestation of pregnancy: Secondary | ICD-10-CM | POA: Diagnosis not present

## 2020-08-03 DIAGNOSIS — F605 Obsessive-compulsive personality disorder: Secondary | ICD-10-CM | POA: Diagnosis not present

## 2020-08-03 DIAGNOSIS — F900 Attention-deficit hyperactivity disorder, predominantly inattentive type: Secondary | ICD-10-CM | POA: Diagnosis not present

## 2020-08-10 DIAGNOSIS — O26842 Uterine size-date discrepancy, second trimester: Secondary | ICD-10-CM | POA: Diagnosis not present

## 2020-08-10 DIAGNOSIS — Z3A36 36 weeks gestation of pregnancy: Secondary | ICD-10-CM | POA: Diagnosis not present

## 2020-08-10 DIAGNOSIS — Z3682 Encounter for antenatal screening for nuchal translucency: Secondary | ICD-10-CM | POA: Diagnosis not present

## 2020-08-10 DIAGNOSIS — O351XX0 Maternal care for (suspected) chromosomal abnormality in fetus, not applicable or unspecified: Secondary | ICD-10-CM | POA: Diagnosis not present

## 2020-08-16 DIAGNOSIS — O365930 Maternal care for other known or suspected poor fetal growth, third trimester, not applicable or unspecified: Secondary | ICD-10-CM | POA: Diagnosis not present

## 2020-08-16 DIAGNOSIS — Z3A37 37 weeks gestation of pregnancy: Secondary | ICD-10-CM | POA: Diagnosis not present

## 2020-08-24 DIAGNOSIS — O4103X0 Oligohydramnios, third trimester, not applicable or unspecified: Secondary | ICD-10-CM | POA: Diagnosis not present

## 2020-08-24 DIAGNOSIS — Z3A38 38 weeks gestation of pregnancy: Secondary | ICD-10-CM | POA: Diagnosis not present

## 2020-08-29 DIAGNOSIS — Z3A39 39 weeks gestation of pregnancy: Secondary | ICD-10-CM | POA: Diagnosis not present

## 2020-08-29 DIAGNOSIS — O26843 Uterine size-date discrepancy, third trimester: Secondary | ICD-10-CM | POA: Diagnosis not present

## 2020-08-30 ENCOUNTER — Other Ambulatory Visit: Payer: Self-pay | Admitting: Obstetrics and Gynecology

## 2020-08-30 LAB — SARS CORONAVIRUS 2 (TAT 6-24 HRS): SARS Coronavirus 2: NEGATIVE

## 2020-08-30 NOTE — H&P (Signed)
Brittany Collins is a 30 y.o. G 1 P 0 at 39 weeks presents for IOL secondary to IUGR  Last EFW 5 pound 12 oz  OB History     Gravida  1   Para      Term      Preterm      AB      Living         SAB      IAB      Ectopic      Multiple      Live Births             Past Medical History:  Diagnosis Date   ADHD    Allergic rhinitis    Anxiety    Chicken pox    Chronic pansinusitis    GERD (gastroesophageal reflux disease)    Headache    History of kidney stones    Migraines    Past Surgical History:  Procedure Laterality Date   WISDOM TOOTH EXTRACTION     Family History: family history includes Arthritis in her mother; Diabetes in her maternal grandfather and maternal grandmother; Heart disease in her father, maternal grandfather, and maternal grandmother; Hypertension in her mother; Sleep apnea in her maternal grandmother and mother; Stroke in her paternal grandfather. Social History:  reports that she has quit smoking. Her smoking use included cigarettes. She has a 7.00 pack-year smoking history. She has never used smokeless tobacco. She reports current alcohol use. She reports that she does not use drugs.     Maternal Diabetes: No Genetic Screening: Normal Maternal Ultrasounds/Referrals: IUGR Fetal Ultrasounds or other Referrals:  Referred to Materal Fetal Medicine  Maternal Substance Abuse:  No Significant Maternal Medications:  None Significant Maternal Lab Results:  Group B Strep negative Other Comments:  None  Review of Systems  All other systems reviewed and are negative. Maternal Medical History:  Prenatal complications: IUGR.      Last menstrual period 11/30/2019. Maternal Exam:  Abdomen: Fetal presentation: vertex  Physical Exam Vitals and nursing note reviewed. Exam conducted with a chaperone present.  Constitutional:      Appearance: Normal appearance.  HENT:     Head: Normocephalic.     Right Ear: Tympanic membrane normal.      Mouth/Throat:     Mouth: Mucous membranes are moist.  Eyes:     Pupils: Pupils are equal, round, and reactive to light.  Cardiovascular:     Rate and Rhythm: Normal rate and regular rhythm.     Pulses: Normal pulses.  Musculoskeletal:     Cervical back: Normal range of motion.  Neurological:     Mental Status: She is alert.    Prenatal labs: ABO, Rh:   Antibody:   Rubella:   RPR:    HBsAg:    HIV:    GBS:     Assessment/Plan: IUGR  39 weeks IOL Admit 2 stage IOL Risks reviewed   Brittany Collins 08/30/2020, 12:56 PM

## 2020-09-01 ENCOUNTER — Other Ambulatory Visit: Payer: Self-pay

## 2020-09-01 ENCOUNTER — Encounter (HOSPITAL_COMMUNITY): Payer: Self-pay

## 2020-09-01 ENCOUNTER — Inpatient Hospital Stay (HOSPITAL_COMMUNITY): Payer: BC Managed Care – PPO

## 2020-09-01 ENCOUNTER — Encounter (HOSPITAL_COMMUNITY)
Admission: RE | Admit: 2020-09-01 | Discharge: 2020-09-01 | Disposition: A | Discharge status: Home / Self Care | Payer: BC Managed Care – PPO | Source: Ambulatory Visit | Attending: Obstetrics and Gynecology | Admitting: Obstetrics and Gynecology

## 2020-09-01 DIAGNOSIS — O43893 Other placental disorders, third trimester: Secondary | ICD-10-CM | POA: Diagnosis not present

## 2020-09-01 DIAGNOSIS — Q980 Klinefelter syndrome karyotype 47, XXY: Secondary | ICD-10-CM | POA: Diagnosis not present

## 2020-09-01 DIAGNOSIS — O365130 Maternal care for known or suspected placental insufficiency, third trimester, not applicable or unspecified: Secondary | ICD-10-CM | POA: Diagnosis not present

## 2020-09-01 DIAGNOSIS — O365930 Maternal care for other known or suspected poor fetal growth, third trimester, not applicable or unspecified: Secondary | ICD-10-CM | POA: Diagnosis not present

## 2020-09-01 DIAGNOSIS — Z3A39 39 weeks gestation of pregnancy: Secondary | ICD-10-CM | POA: Diagnosis not present

## 2020-09-01 DIAGNOSIS — Z23 Encounter for immunization: Secondary | ICD-10-CM | POA: Diagnosis not present

## 2020-09-01 DIAGNOSIS — Z87891 Personal history of nicotine dependence: Secondary | ICD-10-CM | POA: Diagnosis not present

## 2020-09-01 LAB — RAPID HIV SCREEN (HIV 1/2 AB+AG)
HIV 1/2 Antibodies: NONREACTIVE
HIV-1 P24 Antigen - HIV24: NONREACTIVE

## 2020-09-01 LAB — TYPE AND SCREEN
ABO/RH(D): A POS
Antibody Screen: NEGATIVE

## 2020-09-01 LAB — CBC
HCT: 34.3 % — ABNORMAL LOW (ref 36.0–46.0)
Hemoglobin: 11.6 g/dL — ABNORMAL LOW (ref 12.0–15.0)
MCH: 30 pg (ref 26.0–34.0)
MCHC: 33.8 g/dL (ref 30.0–36.0)
MCV: 88.6 fL (ref 80.0–100.0)
Platelets: 268 10*3/uL (ref 150–400)
RBC: 3.87 MIL/uL (ref 3.87–5.11)
RDW: 14.1 % (ref 11.5–15.5)
WBC: 13.8 10*3/uL — ABNORMAL HIGH (ref 4.0–10.5)
nRBC: 0 % (ref 0.0–0.2)

## 2020-09-01 NOTE — Patient Instructions (Signed)
Brittany Collins  09/01/2020   Your procedure is scheduled on:  09/02/2020  Arrive at 0530 at Entrance C on [employer] at [facility]  and [employer]. You are invited to use the FREE valet parking or use the Visitor's parking deck.  Pick up the phone at the desk and dial [phone].  Call this number if you have problems the morning of surgery: [phone]  Remember:   Do not eat food:(After Midnight) Desps de medianoche.  Do not drink clear liquids: (After Midnight) Desps de medianoche.  Take these medicines the morning of surgery with A SIP OF WATER:  none   Do not wear jewelry, make-up or nail polish.  Do not wear lotions, powders, or perfumes. Do not wear deodorant.  Do not shave 48 hours prior to surgery.  Do not bring valuables to the hospital.  [facility] is not   responsible for any belongings or valuables brought to the hospital.  Contacts, dentures or bridgework may not be worn into surgery.  Leave suitcase in the car. After surgery it may be brought to your room.  For patients admitted to the hospital, checkout time is 11:00 AM the day of              discharge.      Please read over the following fact sheets that you were given:     Preparing for Surgery

## 2020-09-02 ENCOUNTER — Encounter (HOSPITAL_COMMUNITY): Payer: Self-pay | Admitting: Obstetrics and Gynecology

## 2020-09-02 ENCOUNTER — Inpatient Hospital Stay (HOSPITAL_COMMUNITY): Payer: BC Managed Care – PPO | Admitting: Anesthesiology

## 2020-09-02 ENCOUNTER — Encounter (HOSPITAL_COMMUNITY)
Admission: AD | Disposition: A | Discharge status: Home / Self Care | Payer: Self-pay | Source: Home / Self Care | Attending: Obstetrics and Gynecology

## 2020-09-02 ENCOUNTER — Other Ambulatory Visit: Payer: Self-pay

## 2020-09-02 ENCOUNTER — Inpatient Hospital Stay (HOSPITAL_COMMUNITY)
Admission: AD | Admit: 2020-09-02 | Discharge: 2020-09-04 | DRG: 788 | Disposition: A | Discharge status: Home / Self Care | Payer: BC Managed Care – PPO | Attending: Obstetrics and Gynecology | Admitting: Obstetrics and Gynecology

## 2020-09-02 DIAGNOSIS — Z3A39 39 weeks gestation of pregnancy: Secondary | ICD-10-CM

## 2020-09-02 DIAGNOSIS — O365930 Maternal care for other known or suspected poor fetal growth, third trimester, not applicable or unspecified: Secondary | ICD-10-CM | POA: Diagnosis present

## 2020-09-02 DIAGNOSIS — O365990 Maternal care for other known or suspected poor fetal growth, unspecified trimester, not applicable or unspecified: Secondary | ICD-10-CM | POA: Diagnosis present

## 2020-09-02 DIAGNOSIS — Z98891 History of uterine scar from previous surgery: Secondary | ICD-10-CM

## 2020-09-02 DIAGNOSIS — Z87891 Personal history of nicotine dependence: Secondary | ICD-10-CM

## 2020-09-02 LAB — RPR: RPR Ser Ql: NONREACTIVE

## 2020-09-02 SURGERY — Surgical Case
Anesthesia: Spinal

## 2020-09-02 MED ORDER — MORPHINE SULFATE (PF) 0.5 MG/ML IJ SOLN
INTRAMUSCULAR | Status: AC
  Filled 2020-09-02: qty 10

## 2020-09-02 MED ORDER — SODIUM CHLORIDE 0.9 % IV SOLN
INTRAVENOUS | Status: AC
  Filled 2020-09-02: qty 2

## 2020-09-02 MED ORDER — MEPERIDINE HCL 25 MG/ML IJ SOLN
INTRAMUSCULAR | Status: AC
  Filled 2020-09-02: qty 1

## 2020-09-02 MED ORDER — MEPERIDINE HCL 25 MG/ML IJ SOLN
INTRAMUSCULAR | Status: DC | PRN
  Administered 2020-09-02 (×2): 12.5 mg via INTRAVENOUS

## 2020-09-02 MED ORDER — SODIUM CHLORIDE 0.9 % IV SOLN
2.0000 g | INTRAVENOUS | Status: AC
  Administered 2020-09-02: 2 g via INTRAVENOUS

## 2020-09-02 MED ORDER — LACTATED RINGERS IV SOLN: INTRAVENOUS | Status: DC

## 2020-09-02 MED ORDER — OXYCODONE HCL 5 MG PO TABS: 5.0000 mg | ORAL_TABLET | Freq: Once | ORAL | Status: DC | PRN

## 2020-09-02 MED ORDER — BUPIVACAINE IN DEXTROSE 0.75-8.25 % IT SOLN
INTRATHECAL | Status: DC | PRN
  Administered 2020-09-02: 1.6 mL via INTRATHECAL

## 2020-09-02 MED ORDER — NALBUPHINE HCL 10 MG/ML IJ SOLN: 5.0000 mg | Freq: Once | INTRAMUSCULAR | Status: DC | PRN

## 2020-09-02 MED ORDER — TETANUS-DIPHTH-ACELL PERTUSSIS 5-2.5-18.5 LF-MCG/0.5 IM SUSY: 0.5000 mL | PREFILLED_SYRINGE | Freq: Once | INTRAMUSCULAR | Status: DC

## 2020-09-02 MED ORDER — SODIUM CHLORIDE 0.9% FLUSH: 3.0000 mL | INTRAVENOUS | Status: DC | PRN

## 2020-09-02 MED ORDER — KETOROLAC TROMETHAMINE 30 MG/ML IJ SOLN
30.0000 mg | Freq: Once | INTRAMUSCULAR | Status: AC | PRN
  Administered 2020-09-02: 30 mg via INTRAVENOUS

## 2020-09-02 MED ORDER — NALOXONE HCL 4 MG/10ML IJ SOLN
1.0000 ug/kg/h | INTRAVENOUS | Status: DC | PRN
  Filled 2020-09-02: qty 5

## 2020-09-02 MED ORDER — MORPHINE SULFATE (PF) 0.5 MG/ML IJ SOLN
INTRAMUSCULAR | Status: DC | PRN
  Administered 2020-09-02: 150 ug via INTRATHECAL

## 2020-09-02 MED ORDER — OXYTOCIN-SODIUM CHLORIDE 30-0.9 UT/500ML-% IV SOLN
INTRAVENOUS | Status: DC | PRN
  Administered 2020-09-02: 300 mL via INTRAVENOUS

## 2020-09-02 MED ORDER — ONDANSETRON HCL 4 MG/2ML IJ SOLN
INTRAMUSCULAR | Status: AC
  Filled 2020-09-02: qty 2

## 2020-09-02 MED ORDER — DIPHENHYDRAMINE HCL 25 MG PO CAPS: 25.0000 mg | ORAL_CAPSULE | ORAL | Status: DC | PRN

## 2020-09-02 MED ORDER — OXYCODONE HCL 5 MG/5ML PO SOLN
5.0000 mg | Freq: Once | ORAL | Status: DC | PRN
Start: 2020-09-02 — End: 2020-09-02

## 2020-09-02 MED ORDER — POVIDONE-IODINE 10 % EX SWAB: 2.0000 "application " | Freq: Once | CUTANEOUS | Status: DC

## 2020-09-02 MED ORDER — OXYTOCIN-SODIUM CHLORIDE 30-0.9 UT/500ML-% IV SOLN
INTRAVENOUS | Status: AC
  Filled 2020-09-02: qty 500

## 2020-09-02 MED ORDER — SENNOSIDES-DOCUSATE SODIUM 8.6-50 MG PO TABS
2.0000 | ORAL_TABLET | Freq: Every day | ORAL | Status: DC
  Administered 2020-09-03 – 2020-09-04 (×2): 2 via ORAL
  Filled 2020-09-02 (×2): qty 2

## 2020-09-02 MED ORDER — OXYTOCIN-SODIUM CHLORIDE 30-0.9 UT/500ML-% IV SOLN
2.5000 [IU]/h | INTRAVENOUS | Status: AC
  Administered 2020-09-02: 2.5 [IU]/h via INTRAVENOUS

## 2020-09-02 MED ORDER — SIMETHICONE 80 MG PO CHEW: 80.0000 mg | CHEWABLE_TABLET | ORAL | Status: DC | PRN

## 2020-09-02 MED ORDER — PROMETHAZINE HCL 25 MG/ML IJ SOLN: 6.2500 mg | INTRAMUSCULAR | Status: DC | PRN

## 2020-09-02 MED ORDER — ZOLPIDEM TARTRATE 5 MG PO TABS: 5.0000 mg | ORAL_TABLET | Freq: Every evening | ORAL | Status: DC | PRN

## 2020-09-02 MED ORDER — DEXAMETHASONE SODIUM PHOSPHATE 10 MG/ML IJ SOLN
INTRAMUSCULAR | Status: DC | PRN
  Administered 2020-09-02: 10 mg via INTRAVENOUS

## 2020-09-02 MED ORDER — NALBUPHINE HCL 10 MG/ML IJ SOLN
5.0000 mg | INTRAMUSCULAR | Status: DC | PRN
Start: 2020-09-02 — End: 2020-09-04

## 2020-09-02 MED ORDER — DIPHENHYDRAMINE HCL 25 MG PO CAPS: 25.0000 mg | ORAL_CAPSULE | Freq: Four times a day (QID) | ORAL | Status: DC | PRN

## 2020-09-02 MED ORDER — IBUPROFEN 600 MG PO TABS
600.0000 mg | ORAL_TABLET | Freq: Four times a day (QID) | ORAL | Status: DC
Start: 2020-09-02 — End: 2020-09-04
  Administered 2020-09-02 – 2020-09-04 (×8): 600 mg via ORAL
  Filled 2020-09-02 (×8): qty 1

## 2020-09-02 MED ORDER — KETOROLAC TROMETHAMINE 30 MG/ML IJ SOLN
INTRAMUSCULAR | Status: AC
  Filled 2020-09-02: qty 1

## 2020-09-02 MED ORDER — PHENYLEPHRINE HCL-NACL 20-0.9 MG/250ML-% IV SOLN
INTRAVENOUS | Status: DC | PRN
  Administered 2020-09-02: 60 ug/min via INTRAVENOUS

## 2020-09-02 MED ORDER — MENTHOL 3 MG MT LOZG: 1.0000 | LOZENGE | OROMUCOSAL | Status: DC | PRN

## 2020-09-02 MED ORDER — SIMETHICONE 80 MG PO CHEW
80.0000 mg | CHEWABLE_TABLET | Freq: Three times a day (TID) | ORAL | Status: DC
  Administered 2020-09-02 – 2020-09-04 (×5): 80 mg via ORAL
  Filled 2020-09-02 (×5): qty 1

## 2020-09-02 MED ORDER — NALBUPHINE HCL 10 MG/ML IJ SOLN: 5.0000 mg | INTRAMUSCULAR | Status: DC | PRN

## 2020-09-02 MED ORDER — FENTANYL CITRATE (PF) 100 MCG/2ML IJ SOLN
INTRAMUSCULAR | Status: DC | PRN
  Administered 2020-09-02: 15 ug via INTRATHECAL

## 2020-09-02 MED ORDER — OXYCODONE HCL 5 MG PO TABS
5.0000 mg | ORAL_TABLET | ORAL | Status: DC | PRN
  Administered 2020-09-03: 5 mg via ORAL
  Filled 2020-09-02: qty 1

## 2020-09-02 MED ORDER — MEPERIDINE HCL 25 MG/ML IJ SOLN: 6.2500 mg | INTRAMUSCULAR | Status: DC | PRN

## 2020-09-02 MED ORDER — COCONUT OIL OIL: 1.0000 "application " | TOPICAL_OIL | Status: DC | PRN

## 2020-09-02 MED ORDER — WITCH HAZEL-GLYCERIN EX PADS: 1.0000 "application " | MEDICATED_PAD | CUTANEOUS | Status: DC | PRN

## 2020-09-02 MED ORDER — DIPHENHYDRAMINE HCL 50 MG/ML IJ SOLN: 12.5000 mg | INTRAMUSCULAR | Status: DC | PRN

## 2020-09-02 MED ORDER — DIBUCAINE (PERIANAL) 1 % EX OINT: 1.0000 "application " | TOPICAL_OINTMENT | CUTANEOUS | Status: DC | PRN

## 2020-09-02 MED ORDER — HYDROMORPHONE HCL 1 MG/ML IJ SOLN: 0.2500 mg | INTRAMUSCULAR | Status: DC | PRN

## 2020-09-02 MED ORDER — PHENYLEPHRINE HCL-NACL 20-0.9 MG/250ML-% IV SOLN
INTRAVENOUS | Status: AC
  Filled 2020-09-02: qty 250

## 2020-09-02 MED ORDER — NALOXONE HCL 0.4 MG/ML IJ SOLN: 0.4000 mg | INTRAMUSCULAR | Status: DC | PRN

## 2020-09-02 MED ORDER — FENTANYL CITRATE (PF) 100 MCG/2ML IJ SOLN
INTRAMUSCULAR | Status: AC
  Filled 2020-09-02: qty 2

## 2020-09-02 MED ORDER — PRENATAL MULTIVITAMIN CH
1.0000 | ORAL_TABLET | Freq: Every day | ORAL | Status: DC
  Administered 2020-09-03 – 2020-09-04 (×2): 1 via ORAL
  Filled 2020-09-02 (×2): qty 1

## 2020-09-02 MED ORDER — ONDANSETRON HCL 4 MG/2ML IJ SOLN
INTRAMUSCULAR | Status: DC | PRN
  Administered 2020-09-02: 4 mg via INTRAVENOUS

## 2020-09-02 SURGICAL SUPPLY — 35 items
APL SKNCLS STERI-STRIP NONHPOA (GAUZE/BANDAGES/DRESSINGS) ×1
BARRIER ADHS 3X4 INTERCEED (GAUZE/BANDAGES/DRESSINGS) IMPLANT
BENZOIN TINCTURE PRP APPL 2/3 (GAUZE/BANDAGES/DRESSINGS) ×1 IMPLANT
BRR ADH 4X3 ABS CNTRL BYND (GAUZE/BANDAGES/DRESSINGS)
CHLORAPREP W/TINT 26ML (MISCELLANEOUS) ×2 IMPLANT
CLAMP CORD UMBIL (MISCELLANEOUS) IMPLANT
CLOTH BEACON ORANGE TIMEOUT ST (SAFETY) ×2 IMPLANT
DRSG OPSITE POSTOP 4X10 (GAUZE/BANDAGES/DRESSINGS) ×2 IMPLANT
ELECT REM PT RETURN 9FT ADLT (ELECTROSURGICAL) ×2
ELECTRODE REM PT RTRN 9FT ADLT (ELECTROSURGICAL) ×1 IMPLANT
EXTRACTOR VACUUM M CUP 4 TUBE (SUCTIONS) IMPLANT
GLOVE BIO SURGEON STRL SZ 6.5 (GLOVE) ×2 IMPLANT
GLOVE BIOGEL PI IND STRL 7.0 (GLOVE) ×1 IMPLANT
GLOVE BIOGEL PI INDICATOR 7.0 (GLOVE) ×1
GOWN STRL REUS W/TWL LRG LVL3 (GOWN DISPOSABLE) ×4 IMPLANT
KIT ABG SYR 3ML LUER SLIP (SYRINGE) IMPLANT
NDL HYPO 25X5/8 SAFETYGLIDE (NEEDLE) ×1 IMPLANT
NEEDLE HYPO 22GX1.5 SAFETY (NEEDLE) IMPLANT
NEEDLE HYPO 25X5/8 SAFETYGLIDE (NEEDLE) ×2 IMPLANT
NS IRRIG 1000ML POUR BTL (IV SOLUTION) ×2 IMPLANT
PACK C SECTION WH (CUSTOM PROCEDURE TRAY) ×2 IMPLANT
PAD OB MATERNITY 4.3X12.25 (PERSONAL CARE ITEMS) ×2 IMPLANT
PENCIL SMOKE EVAC W/HOLSTER (ELECTROSURGICAL) ×2 IMPLANT
STRIP CLOSURE SKIN 1/2X4 (GAUZE/BANDAGES/DRESSINGS) ×1 IMPLANT
SUT CHROMIC 0 CTX 36 (SUTURE) ×4 IMPLANT
SUT PLAIN 0 NONE (SUTURE) IMPLANT
SUT PLAIN 2 0 XLH (SUTURE) IMPLANT
SUT VIC AB 0 CT1 27 (SUTURE) ×6
SUT VIC AB 0 CT1 27XBRD ANBCTR (SUTURE) ×3 IMPLANT
SUT VIC AB 4-0 KS 27 (SUTURE) IMPLANT
SYR CONTROL 10ML LL (SYRINGE) IMPLANT
TOWEL OR 17X24 6PK STRL BLUE (TOWEL DISPOSABLE) ×2 IMPLANT
TRAY FOLEY W/BAG SLVR 14FR LF (SET/KITS/TRAYS/PACK) ×2 IMPLANT
VACUUM CUP M-STYLE MYSTIC II (SUCTIONS) IMPLANT
WATER STERILE IRR 1000ML POUR (IV SOLUTION) ×2 IMPLANT

## 2020-09-02 NOTE — Transfer of Care (Signed)
Immediate Anesthesia Transfer of Care Note  Patient: Brittany Collins  Procedure(s) Performed: PRIMARY CESAREAN SECTION EDC: 09-05-20 ALLERG: SULFA, TRAMADOL  Patient Location: PACU  Anesthesia Type:Spinal  Level of Consciousness: awake  Airway & Oxygen Therapy: Patient Spontanous Breathing  Post-op Assessment: Report given to RN  Post vital signs: Reviewed and stable  Last Vitals:  Vitals Value Taken Time  BP    Temp    Pulse    Resp    SpO2      Last Pain:  Vitals:   09/02/20 0600  TempSrc: Oral  PainSc: 0-No pain         Complications: No notable events documented.

## 2020-09-02 NOTE — Social Work (Signed)
MOB was referred for history of anxiety.   * Referral screened out by Clinical Social Worker because none of the following criteria appear to apply:  ~ History of anxiety/depression during this pregnancy, or of post-partum depression following prior delivery. No prenatal concerns noted. ~ Diagnosis of anxiety and/or depression within last 3 years. CSW reviewed chart and notes a diagnosis date of 2018 or earlier. OR * MOB's symptoms currently being treated with medication and/or therapy.  Please contact the Clinical Social Worker if needs arise, by MOB request, or if MOB scores greater than 9/yes to question 10 on Edinburgh Postpartum Depression Screen.  Palmira Stickle, LCSWA Clinical Social Work [facility]  [phone]  

## 2020-09-02 NOTE — Progress Notes (Signed)
Patient has elected to undergo C Section instead of IOL. She has been counseled by myself and Dr Rana Snare in the office H and P on the chart BP 132/81   Pulse 89   Temp 98.2 F (36.8 C) (Oral)   Resp 18   Ht 5\' 4"  (1.626 m)   Wt 79.7 kg   LMP 11/30/2019   BMI 30.14 kg/m  Results for orders placed or performed during the hospital encounter of 09/01/20 (from the past 24 hour(s))  CBC     Status: Abnormal   Collection Time: 09/01/20 12:07 PM  Result Value Ref Range   WBC 13.8 (H) 4.0 - 10.5 K/uL   RBC 3.87 3.87 - 5.11 MIL/uL   Hemoglobin 11.6 (L) 12.0 - 15.0 g/dL   HCT 11/01/20 (L) 63.0 - 16.0 %   MCV 88.6 80.0 - 100.0 fL   MCH 30.0 26.0 - 34.0 pg   MCHC 33.8 30.0 - 36.0 g/dL   RDW 10.9 32.3 - 55.7 %   Platelets 268 150 - 400 K/uL   nRBC 0.0 0.0 - 0.2 %  Rapid HIV screen (HIV 1/2 Ab+Ag)     Status: None   Collection Time: 09/01/20 12:07 PM  Result Value Ref Range   HIV-1 P24 Antigen - HIV24 NON REACTIVE NON REACTIVE   HIV 1/2 Antibodies NON REACTIVE NON REACTIVE   Interpretation (HIV Ag Ab)      A non reactive test result means that HIV 1 or HIV 2 antibodies and HIV 1 p24 antigen were not detected in the specimen.  Type and screen [facility]     Status: None   Collection Time: 09/01/20 12:13 PM  Result Value Ref Range   ABO/RH(D) A POS    Antibody Screen NEG    Sample Expiration      09/04/2020,2359 Performed at [facility], [address] [place], [place]    Will proceed with Primary LTCS

## 2020-09-02 NOTE — Anesthesia Postprocedure Evaluation (Signed)
Anesthesia Post Note  Patient: Brittany Collins  Procedure(s) Performed: PRIMARY CESAREAN SECTION EDC: 09-05-20 ALLERG: SULFA, TRAMADOL     Patient location during evaluation: PACU Anesthesia Type: Spinal Level of consciousness: awake and alert Pain management: pain level controlled Vital Signs Assessment: post-procedure vital signs reviewed and stable Respiratory status: spontaneous breathing, nonlabored ventilation and respiratory function stable Cardiovascular status: blood pressure returned to baseline and stable Postop Assessment: no apparent nausea or vomiting Anesthetic complications: no   No notable events documented.  Last Vitals:  Vitals:   09/02/20 0950 09/02/20 0955  BP:    Pulse:  64  Resp: 20 16  Temp:    SpO2: 98% 97%    Last Pain:  Vitals:   09/02/20 0945  TempSrc: Axillary  PainSc:    Pain Goal:    LLE Motor Response: Purposeful movement (09/02/20 0945)   RLE Motor Response: Purposeful movement (09/02/20 0945)   L Sensory Level: L1-Inguinal (groin) region (09/02/20 0945) R Sensory Level: L1-Inguinal (groin) region (09/02/20 0945) Epidural/Spinal Function Cutaneous sensation: Able to Wiggle Toes (09/02/20 0945), Patient able to flex knees: Yes (09/02/20 0945), Patient able to lift hips off bed: No (09/02/20 0945), Back pain beyond tenderness at insertion site: No (09/02/20 0945), Progressively worsening motor and/or sensory loss: No (09/02/20 0945), Bowel and/or bladder incontinence post epidural: No (09/02/20 0945)  Lowella Curb

## 2020-09-02 NOTE — Lactation Note (Signed)
This note was copied from a baby's chart. Lactation Consultation Note  Patient Name: Brittany Collins [id number] Date: 09/02/2020 Reason for consult: Initial assessment;Primapara;1st time breastfeeding;Term;Infant < 6lbs Age:31 hours   P1 mother whose infant is now 32 hours old.  This is a term baby at 39+4 weeks.    Baby asleep STS on mother's chest when I arrived.  Breast feeding basics reviewed.  Mother is able to hand express colostrum and will finger feed/spoon feed any EBM she obtains to baby.  Encouraged her to call her RN/LC for latch assistance as needed.    Mother will feed 8-12 times/24 hours or sooner if baby shows cues.  RN provided manual pump and breast shells.  Mother's nipples everted and short shafted.  Slight bruising noted to the left nipple.    Mom made aware of O/P services, breastfeeding support groups, community resources, and our phone # for post-discharge questions.  Mother has a DEBP for home use.  Father and support person present.  RN updated.   Maternal Data Has patient been taught Hand Expression?: Yes Does the patient have breastfeeding experience prior to this delivery?: No  Feeding Mother's Current Feeding Choice: Breast Milk  LATCH Score                    Lactation Tools Discussed/Used Tools: Shells;Pump Breast pump type: Manual Pump Education: Setup, frequency, and cleaning (Completed by RN) Reason for Pumping: Stimulation and nipple eversion Pumping frequency: Prn  Interventions Interventions: Education  Discharge Pump: Manual;Personal WIC Program: No  Consult Status Consult Status: Follow-up Date: 09/03/20 Follow-up type: In-patient    Brittany Collins 09/02/2020, 12:54 PM

## 2020-09-02 NOTE — Lactation Note (Signed)
This note was copied from a baby's chart. Lactation Consultation Note  Patient Name: Brittany Collins MCNOB'S Date: 09/02/2020 Reason for consult: Follow-up assessment;Term;Primapara;1st time breastfeeding Age:31 hours   LC Follow Up Consult:  Mother requested latch assistance.  Baby was not showing feeding cues when I arrived.  Mother hand expressed colostrum drops which I finger fed to baby.  Assisted to latch in the cross cradle hold, however, "[name]" was not interested in initiating a suck.  Demonstrated gentle stimulation but he became fussy.  Placed him STS on mother's chest and he fell asleep; hat and blanket on.  Reassurance provided and encouraged mother to continue hand expression and attempting to latch when he shows cues or at least every three hours due to him weighing < 6 lbs at birth.    Family present.     Maternal Data Has patient been taught Hand Expression?: Yes Does the patient have breastfeeding experience prior to this delivery?: No  Feeding Mother's Current Feeding Choice: Breast Milk  LATCH Score Latch: Too sleepy or reluctant, no latch achieved, no sucking elicited.  Audible Swallowing: None  Type of Nipple: Everted at rest and after stimulation  Comfort (Breast/Nipple): Soft / non-tender  Hold (Positioning): Assistance needed to correctly position infant at breast and maintain latch.  LATCH Score: 5   Lactation Tools Discussed/Used Tools: Shells;Pump Breast pump type: Manual Pump Education: Setup, frequency, and cleaning;Milk Storage Reason for Pumping: Prn Pumping frequency: Prn  Interventions Interventions: Breast feeding basics reviewed;Assisted with latch;Skin to skin;Breast massage;Hand express;Adjust position;Hand pump;Shells;Position options;Support pillows;Education  Discharge Pump: Manual;Personal  Consult Status Consult Status: Follow-up Date: 09/03/20 Follow-up type: In-patient    Shalena Ezzell R Kerby Borner 09/02/2020, 6:22 PM

## 2020-09-02 NOTE — Anesthesia Preprocedure Evaluation (Signed)
Anesthesia Evaluation  Patient identified by MRN, date of birth, ID band Patient awake    Reviewed: Allergy & Precautions, NPO status , Patient's Chart, lab work & pertinent test results  Airway Mallampati: II  TM Distance: >3 FB Neck ROM: Full    Dental no notable dental hx.    Pulmonary neg pulmonary ROS, former smoker,    Pulmonary exam normal breath sounds clear to auscultation       Cardiovascular negative cardio ROS Normal cardiovascular exam Rhythm:Regular Rate:Normal     Neuro/Psych  Headaches, Anxiety negative psych ROS   GI/Hepatic Neg liver ROS, GERD  ,  Endo/Other  negative endocrine ROS  Renal/GU negative Renal ROS  negative genitourinary   Musculoskeletal negative musculoskeletal ROS (+)   Abdominal   Peds negative pediatric ROS (+)  Hematology negative hematology ROS (+)   Anesthesia Other Findings   Reproductive/Obstetrics (+) Pregnancy                             Anesthesia Physical Anesthesia Plan  ASA: 2  Anesthesia Plan: Spinal   Post-op Pain Management:    Induction:   PONV Risk Score and Plan: 2 and Treatment may vary due to age or medical condition  Airway Management Planned: Natural Airway  Additional Equipment:   Intra-op Plan:   Post-operative Plan:   Informed Consent: I have reviewed the patients History and Physical, chart, labs and discussed the procedure including the risks, benefits and alternatives for the proposed anesthesia with the patient or authorized representative who has indicated his/her understanding and acceptance.     Dental advisory given  Plan Discussed with: CRNA  Anesthesia Plan Comments:         Anesthesia Quick Evaluation

## 2020-09-02 NOTE — Op Note (Signed)
Brittany Collins, CAPPS MEDICAL RECORD NO: [id number] ACCOUNT NO: [id number] DATE OF BIRTH: 24-Aug-1990 FACILITY: [facility] LOCATION: [id number] PHYSICIAN: Chlora Mcbain L. Vincente Poli, MD  Operative Report   DATE OF PROCEDURE: 09/02/2020   PREOPERATIVE DIAGNOSES:   1.  Intrauterine pregnancy at 39 weeks and 4 days. 2.  IUGR. 3.  Declines labor. 4.  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  POSTOPERATIVE DIAGNOSES:   1.  Intrauterine pregnancy at 39 weeks and 4 days. 2.  IUGR. 3.  Declines labor. 4.  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  PROCEDURE:  Primary low transverse cesarean section.  SURGEON:  Marvella Jenning L. Anamae Rochelle, MD.  TYPE OF ANESTHESIA:  Spinal.  ESTIMATED BLOOD LOSS:  Less than 300 mL  COMPLICATIONS:  None.  DRAINS:  Foley catheter.  PATHOLOGY:  Placenta sent to pathology.  DESCRIPTION OF PROCEDURE:  The patient was taken to the operating room after informed consent was obtained.  She was prepped and draped in the usual sterile fashion after spinal was placed and Foley was placed.  A low transverse incision was made after  the Pfannenstiel incision was made with a scalpel, was carried down to the fascia with the Bovie.  The rectus muscles were separated in the midline and then the incision was extended laterally.  The rectus muscles were then separated and then divided in  the midline.  The peritoneum was entered and the peritoneal incision was then stretched.  The bladder blade was inserted and the bladder flap was then created in the usual fashion with sharp and blunt dissection.  A low transverse incision was made in  the uterus.  Uterus was then entered using a hemostat.  Amniotic fluid was clear.  The baby was in cephalic presentation and was a female infant and appeared to be IUGR.  The cord was clamped and cut.  The baby was handed to the neonatal team and then cord  blood was obtained.  The placenta was manually removed and noted to be normal and intact with a 3-vessel cord.  The uterus was  exteriorized and cleared of all clots and debris.  The uterine incision was closed in one layer using 0 chromic in a running  locked stitch.  Irrigation was performed.  Uterus was otherwise unremarkable.  Adnexa were normal.  The peritoneum was closed using 0 Vicryl in a running stitch.  The fascia was closed using 0 Vicryl in a running stitch and the subcutaneous was closed  with 3-0 Vicryl on a Keith needle.  Benzoin, Steri-Strips, and a honeycomb dressing were applied.  All sponge, lap and instrument counts were correct x2.  The patient went to recovery room in stable condition.   Xaver.Mink D: 09/02/2020 8:33:05 am T: 09/02/2020 10:12:00 am  JOB: [id number]/ [id number]

## 2020-09-02 NOTE — Anesthesia Procedure Notes (Signed)
Spinal  Patient location during procedure: OB Start time: 09/02/2020 7:37 AM End time: 09/02/2020 7:42 AM Reason for block: surgical anesthesia Staffing Performed: anesthesiologist  Anesthesiologist: Lowella Curb, MD Preanesthetic Checklist Completed: patient identified, IV checked, risks and benefits discussed, surgical consent, monitors and equipment checked, pre-op evaluation and timeout performed Spinal Block Patient position: sitting Prep: DuraPrep and site prepped and draped Patient monitoring: heart rate, cardiac monitor, continuous pulse ox and blood pressure Approach: midline Location: L3-4 Injection technique: single-shot Needle Needle type: Pencan  Needle gauge: 24 G Needle length: 10 cm Assessment Sensory level: T4 Events: CSF return

## 2020-09-02 NOTE — Brief Op Note (Signed)
09/02/2020  8:25 AM  PATIENT:  Orlene Och  30 y.o. 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   Delay start of Pharmacological VTE agent (>24hrs) due to surgical blood loss or risk of bleeding: yes

## 2020-09-03 LAB — CBC
HCT: 28.1 % — ABNORMAL LOW (ref 36.0–46.0)
Hemoglobin: 9.6 g/dL — ABNORMAL LOW (ref 12.0–15.0)
MCH: 30.6 pg (ref 26.0–34.0)
MCHC: 34.2 g/dL (ref 30.0–36.0)
MCV: 89.5 fL (ref 80.0–100.0)
Platelets: 219 10*3/uL (ref 150–400)
RBC: 3.14 MIL/uL — ABNORMAL LOW (ref 3.87–5.11)
RDW: 14.5 % (ref 11.5–15.5)
WBC: 17.9 10*3/uL — ABNORMAL HIGH (ref 4.0–10.5)
nRBC: 0 % (ref 0.0–0.2)

## 2020-09-03 MED ORDER — ACETAMINOPHEN 325 MG PO TABS
650.0000 mg | ORAL_TABLET | Freq: Four times a day (QID) | ORAL | Status: DC | PRN
  Administered 2020-09-03 – 2020-09-04 (×2): 650 mg via ORAL
  Filled 2020-09-03 (×2): qty 2

## 2020-09-03 NOTE — Lactation Note (Signed)
This note was copied from a baby's chart. Lactation Consultation Note  Patient Name: Brittany Collins [id number] Date: 09/03/2020   Age:31 hours P1, term female infant less than 6 lbs. LC entered the room, mom was wearing breast shells in bra, LC discussed with mom to wear breast shells during the day and not at night  LC entered the room, infant was asleep in basinet, per mom, she attempted to latch infant less than 15 minutes prior to LC walking in room, with RN assistance, infant did not latch, infant was given 3 mls of colostrum that was hand expressed by spoon. Per mom, she will continue to work on latching infant at the breast according to hunger cues, 8 to 12+ or more  times within 24 hours, skin to skin.  Mom will continue to ask RN or LC for assistance with latching infant at the breast if needed.  Mom prefers to give infant her EBM at this time by spoon if infant doesn't latch at the breast  and not supplement infant  with donor breast milk at this time. Mom prefers to continue using  hand pump and not DEBP at this time.  LC suggested mom pre-pump breast with hand pump prior to latching infant to help evert nipple shaft out more. LC discussed infant's input and output with parents.   Maternal Data    Feeding    LATCH Score                    Lactation Tools Discussed/Used    Interventions    Discharge    Consult Status      Danelle Earthly 09/03/2020, 1:06 AM

## 2020-09-03 NOTE — Progress Notes (Signed)
POD # 1  Doing well. No complaints.   BP 105/65 (BP Location: Right Arm)   Pulse 61   Temp 97.7 F (36.5 C) (Oral)   Resp 17   Ht 5\' 4"  (1.626 m)   Wt 79.7 kg   LMP 11/30/2019   SpO2 98%   Breastfeeding Unknown   BMI 30.14 kg/m  Results for orders placed or performed during the hospital encounter of 09/02/20 (from the past 24 hour(s))  CBC     Status: Abnormal   Collection Time: 09/03/20  4:24 AM  Result Value Ref Range   WBC 17.9 (H) 4.0 - 10.5 K/uL   RBC 3.14 (L) 3.87 - 5.11 MIL/uL   Hemoglobin 9.6 (L) 12.0 - 15.0 g/dL   HCT 11/03/20 (L) 24.4 - 97.5 %   MCV 89.5 80.0 - 100.0 fL   MCH 30.6 26.0 - 34.0 pg   MCHC 34.2 30.0 - 36.0 g/dL   RDW 30.0 51.1 - 02.1 %   Platelets 219 150 - 400 K/uL   nRBC 0.0 0.0 - 0.2 %   Abdomen is soft and non tender Pressure dressing removed and honeycomb removed Will have nurse replace the dressing  Incision looks dry  POD # 1  Doing well Routine care

## 2020-09-03 NOTE — Lactation Note (Signed)
This note was copied from a baby's chart. Lactation Consultation Note  Patient Name: Brittany Collins HYHOO'I Date: 09/03/2020   Age:31 hours  Baby is to have hearing screen while asleep.  After LC will follow up.  [name] 09/03/2020, 11:16 AM

## 2020-09-03 NOTE — Lactation Note (Addendum)
This note was copied from a baby's chart. Lactation Consultation Note  Patient Name: Brittany Collins [id number] Date: 09/03/2020 Reason for consult: Follow-up assessment Age:31 hours  P1, Baby < 6 lbs. Mother pumping upon LC entering.Assisted with latch and baby breastfed for approx 12 min  Baby was spoon fed 2 ml.  Returned to room.  Due to weight loss and feeding difficulties offered family donor milk.  Consent signed. After breastfeeding, demonstrated how to use 5 french feeding tube at the breast as an option if infant feeds well at the breast.  Because infant was sleepy after breastfeeding, reviewed how to finger syringe feed baby. Baby took 10 ml.  Baby is to supplemented (at least 3-4 hours) with each feeding with the exception of cluster feeding.  Reviewed volume guidelines increasing per day of life. Mother will continue to pump and give volume back to baby. Encoruaged skin to skin.   Maternal Data Has patient been taught Hand Expression?: Yes Does the patient have breastfeeding experience prior to this delivery?: No  Feeding Mother's Current Feeding Choice: Breast Milk and Donor Milk  LATCH Score Latch: Repeated attempts needed to sustain latch, nipple held in mouth throughout feeding, stimulation needed to elicit sucking reflex.  Audible Swallowing: A few with stimulation  Type of Nipple: Everted at rest and after stimulation  Comfort (Breast/Nipple): Soft / non-tender  Hold (Positioning): Assistance needed to correctly position infant at breast and maintain latch.  LATCH Score: 7   Lactation Tools Discussed/Used    Interventions Interventions: Breast feeding basics reviewed;Hand express;Adjust position;Support pillows;DEBP;Education  Discharge Pump: DEBP;Personal  Consult Status Consult Status: Follow-up Date: 09/04/20 Follow-up type: In-patient    [name] [facility] 09/03/2020, 1:56 PM

## 2020-09-04 ENCOUNTER — Inpatient Hospital Stay (HOSPITAL_COMMUNITY)
Admission: AD | Admit: 2020-09-04 | Discharge: 2020-09-05 | Disposition: A | Discharge status: Home / Self Care | Payer: BC Managed Care – PPO | Attending: Obstetrics and Gynecology | Admitting: Obstetrics and Gynecology

## 2020-09-04 DIAGNOSIS — Z888 Allergy status to other drugs, medicaments and biological substances status: Secondary | ICD-10-CM | POA: Insufficient documentation

## 2020-09-04 DIAGNOSIS — Z885 Allergy status to narcotic agent status: Secondary | ICD-10-CM | POA: Insufficient documentation

## 2020-09-04 DIAGNOSIS — Z8249 Family history of ischemic heart disease and other diseases of the circulatory system: Secondary | ICD-10-CM | POA: Insufficient documentation

## 2020-09-04 DIAGNOSIS — R42 Dizziness and giddiness: Secondary | ICD-10-CM | POA: Insufficient documentation

## 2020-09-04 DIAGNOSIS — R5383 Other fatigue: Secondary | ICD-10-CM | POA: Insufficient documentation

## 2020-09-04 DIAGNOSIS — Z882 Allergy status to sulfonamides status: Secondary | ICD-10-CM | POA: Insufficient documentation

## 2020-09-04 DIAGNOSIS — R110 Nausea: Secondary | ICD-10-CM | POA: Insufficient documentation

## 2020-09-04 DIAGNOSIS — Z98891 History of uterine scar from previous surgery: Secondary | ICD-10-CM | POA: Insufficient documentation

## 2020-09-04 DIAGNOSIS — Z87891 Personal history of nicotine dependence: Secondary | ICD-10-CM | POA: Insufficient documentation

## 2020-09-04 MED ORDER — OXYCODONE HCL 5 MG PO TABS: 5.0000 mg | ORAL_TABLET | ORAL | 0 refills | Status: DC | PRN

## 2020-09-04 MED ORDER — IBUPROFEN 600 MG PO TABS: 600.0000 mg | ORAL_TABLET | Freq: Four times a day (QID) | ORAL | 0 refills | Status: DC | PRN

## 2020-09-04 NOTE — Progress Notes (Signed)
Subjective: Postpartum Day 2: Cesarean Delivery Patient reports incisional pain, tolerating PO, + flatus, and no problems voiding.    Objective: Vital signs in last 24 hours: Temp:  [98.3 F (36.8 C)-98.6 F (37 C)] 98.3 F (36.8 C) (08/08 0647) Pulse Rate:  [61-83] 61 (08/08 0647) Resp:  [16-18] 16 (08/08 0647) BP: (104-114)/(58-67) 112/64 (08/08 0647) SpO2:  [98 %] 98 % (08/08 0647)  Physical Exam:  General: alert, cooperative, appears stated age, and no distress Lochia: appropriate Uterine Fundus: firm Incision: healing well DVT Evaluation: No evidence of DVT seen on physical exam.  Recent Labs    09/01/20 1207 09/03/20 0424  HGB 11.6* 9.6*  HCT 34.3* 28.1*    Assessment/Plan: Status post Cesarean section. Doing well postoperatively.  Continue current care.  Turner Daniels 09/04/2020, 10:01 AM

## 2020-09-04 NOTE — Lactation Note (Signed)
This note was copied from a baby's chart. Lactation Consultation Note Baby not latching to breast. Mom has short shafted nipples to Lt. Nipple. Breast are full feeling. Not very compressible. Colostrum expressed when compressed.  Encouraged mom to pre-pump for about 5 minutes to soften breast and evert nipples more before latching.  Mom is giving colostrum and DM. Gave mom #24 flange to Rt. Nipple and #27 flange to Lt. Nipple.  Baby given supplement via finger and curve tip syring. Encouraged mom to pump every 3 hrs.  Patient Name: Brittany Collins [id number] Date: 09/04/2020 Reason for consult: Mother's request;Follow-up assessment;Difficult latch;Primapara;Infant < 6lbs;Term Age:62 hours  Maternal Data Has patient been taught Hand Expression?: Yes Does the patient have breastfeeding experience prior to this delivery?: No  Feeding    LATCH Score Latch: Too sleepy or reluctant, no latch achieved, no sucking elicited.  Audible Swallowing: None  Type of Nipple: Everted at rest and after stimulation (short shaft to Lt. nipple)  Comfort (Breast/Nipple): Filling, red/small blisters or bruises, mild/mod discomfort (edema/full feeling breast)  Hold (Positioning): Full assist, staff holds infant at breast  LATCH Score: 3   Lactation Tools Discussed/Used Tools: Shells;Pump;Flanges Flange Size: 27;24 Breast pump type: Double-Electric Breast Pump Pump Education: Setup, frequency, and cleaning;Milk Storage Reason for Pumping: supplement/stimulation/pre-pump prior to latching  Interventions Interventions: Breast feeding basics reviewed;Support pillows;Assisted with latch;Position options;Skin to skin;Expressed milk;Breast massage;Hand express;Shells;Pre-pump if needed;Reverse pressure;DEBP;Breast compression;Adjust position  Discharge    Consult Status Consult Status: Follow-up Date: 09/04/20 Follow-up type: In-patient    Ahmaud Duthie, Diamond Nickel 09/04/2020, 1:17 AM

## 2020-09-04 NOTE — Lactation Note (Signed)
This note was copied from a baby's chart. Lactation Consultation Note  Patient Name: Brittany Collins [id number] Date: 09/04/2020   Age:31 hours  Mom had a question about exclusively pumping after her milk comes to volume, which I answered. Mom has a [facility] & a hand pump at home. Mom is using size a 27 flange for her L breast & a size 24 flange for her R breast. She is comfortable with pumping.  Mom is pleased that infant is taking more volume now that infant is being bottle fed instead of using a curved-tip syringe. Cristal Deer, RN initiated bottle feeding & infant took 30 mL right before I entered the room (that was the 2nd bottle feeding this morning).  Mom has no breast complaints & no other questions. Mom knows to pump when "[name]" gets a bottle. Mom knows how to reach [place] for any post-discharge questions.     Lurline Hare [facility] 09/04/2020, 1:52 PM

## 2020-09-04 NOTE — Lactation Note (Signed)
This note was copied from a baby's chart. Lactation Consultation Note Went back to see how mom's breast were after pumping. Mom has just finished pumping. Praised mom. Mom pumped 8 ml's Noted significant softening of breast. Explained to mom that she has a lot of colostrum in her breast and needs to get it out to give to baby.  Suggested mom pre-pump about 5 min. To soften and evert nipples before latching baby.  Asked mom to call for [facility] for next feeding. LC will try latching to Rt. Nipple LC attempted to latch to Rt. Nipple when I just went back d/t baby fussy and rooting. Baby wouldn't suckle. Baby passing some gas so it could had been gas pain.   If baby will not latch next attempt LC will try NS. But mom will probably need #24 NS and LC feels that will be large for baby. If he isn't latching after that then [facility] will recommend ST consult.  Gave mom supplement sheet. Mom giving 10 ml. Baby isn't BF so baby needs more than 10 ml. Explained that to mom. Gave supplement sheet. According to hours of age.  Mom will call for next feeding.  Patient Name: Brittany Collins [id number] Date: 09/04/2020 Reason for consult: Follow-up assessment;Primapara;Infant < 6lbs;Difficult latch Age:45 hours  Maternal Data Has patient been taught Hand Expression?: Yes Does the patient have breastfeeding experience prior to this delivery?: No  Feeding    LATCH Score Latch: Too sleepy or reluctant, no latch achieved, no sucking elicited.  Audible Swallowing: None  Type of Nipple: Everted at rest and after stimulation (short shaft to Lt. nipple)  Comfort (Breast/Nipple): Filling, red/small blisters or bruises, mild/mod discomfort (edema/full feeling breast)  Hold (Positioning): Full assist, staff holds infant at breast  LATCH Score: 3   Lactation Tools Discussed/Used Tools: Shells;Pump;Flanges Flange Size: 27;24 Breast pump type: Double-Electric Breast Pump Pump Education: Setup, frequency, and  cleaning;Milk Storage Reason for Pumping: supplement/stimulation/pre-pump prior to latching Pumping frequency: 2 1/2- 3 hrs Pumped volume: 8 mL  Interventions Interventions: Breast feeding basics reviewed;Support pillows;Assisted with latch;Position options;Skin to skin;Expressed milk;Breast massage;Hand express;Shells;Pre-pump if needed;Reverse pressure;DEBP;Breast compression;Adjust position  Discharge    Consult Status Consult Status: Follow-up Date: 09/04/20 Follow-up type: In-patient    Brittany Collins, Brittany Collins 09/04/2020, 1:58 AM

## 2020-09-04 NOTE — Progress Notes (Deleted)
Subjective: Postpartum Day 0: Cesarean Delivery Patient reports tolerating PO.    Objective: Vital signs in last 24 hours: Temp:  [98.3 F (36.8 C)-98.6 F (37 C)] 98.3 F (36.8 C) (08/08 0647) Pulse Rate:  [61-83] 61 (08/08 0647) Resp:  [16-18] 16 (08/08 0647) BP: (104-114)/(58-67) 112/64 (08/08 0647) SpO2:  [98 %] 98 % (08/08 0647)  Physical Exam:  General: alert, cooperative, appears stated age, and mild distress Lochia: appropriate Uterine Fundus: firm Incision: pressure bandage dry DVT Evaluation: No evidence of DVT seen on physical exam.  Recent Labs    09/01/20 1207 09/03/20 0424  HGB 11.6* 9.6*  HCT 34.3* 28.1*    Assessment/Plan: Status post Cesarean section. Doing well postoperatively.  Continue current care.  Turner Daniels 09/04/2020, 10:09 AM

## 2020-09-04 NOTE — Discharge Summary (Signed)
Postpartum Discharge Summary       Patient Name: Brittany Collins DOB: 1990-04-18 MRN: [id number]  Date of admission: 09/02/2020 Delivery date:09/02/2020  Delivering provider: Dian Queen  Date of discharge: 09/04/2020  Admitting diagnosis: IUGR (intrauterine growth restriction) affecting care of mother [O36.5990] S/P cesarean section [Z98.891] Intrauterine pregnancy: [redacted]w[redacted]d    Secondary diagnosis:  Active Problems:   IUGR (intrauterine growth restriction) affecting care of mother   S/P cesarean section  Additional problems:      Discharge diagnosis: Term Pregnancy Delivered and IUGR                                               Post partum procedures:   Augmentation: N/A Complications: None  Hospital course: Sceduled C/S   30y.o. yo G1P1001 at 332w4das admitted to the hospital 09/02/2020 for scheduled cesarean section with the following indication:Elective Primary.Delivery details are as follows:  Membrane Rupture Time/Date: 8:03 AM ,09/02/2020   Delivery Method:C-Section, Low Transverse  Details of operation can be found in separate operative note.  Patient had an uncomplicated postpartum course.  She is ambulating, tolerating a regular diet, passing flatus, and urinating well. Patient is discharged home in stable condition on  09/04/20        Newborn Data: Birth date:09/02/2020  Birth time:8:04 AM  Gender:Female  Living status:Living  Apgars:8 ,9  Weight:2720 g     Magnesium Sulfate received: No BMZ received: No Rhophylac:N/A MMR:No T-DaP:Given prenatally Flu: Yes Transfusion:No  Physical exam  Vitals:   09/03/20 0626 09/03/20 1223 09/03/20 2039 09/04/20 0647  BP: 105/65 (!) 104/58 114/67 112/64  Pulse: 61 79 83 61  Resp: 17  18 16   Temp: 97.7 F (36.5 C) 98.6 F (37 C) 98.4 F (36.9 C) 98.3 F (36.8 C)  TempSrc: Oral Oral Oral Oral  SpO2: 98% 98% 98% 98%  Weight:      Height:       General: alert, cooperative, and no distress Lochia:  appropriate Uterine Fundus: firm Incision: Healing well with no significant drainage DVT Evaluation: No evidence of DVT seen on physical exam. Labs: Lab Results  Component Value Date   WBC 17.9 (H) 09/03/2020   HGB 9.6 (L) 09/03/2020   HCT 28.1 (L) 09/03/2020   MCV 89.5 09/03/2020   PLT 219 09/03/2020   CMP Latest Ref Rng & Units 05/25/2019  Glucose 65 - 99 mg/dL 84  BUN 6 - 20 mg/dL 12  Creatinine 0.57 - 1.00 mg/dL 0.78  Sodium 134 - 144 mmol/L 140  Potassium 3.5 - 5.2 mmol/L 4.1  Chloride 96 - 106 mmol/L 105  CO2 20 - 29 mmol/L 23  Calcium 8.7 - 10.2 mg/dL 9.2  Total Protein 6.0 - 8.5 g/dL 7.0  Total Bilirubin 0.0 - 1.2 mg/dL 0.2  Alkaline Phos 39 - 117 IU/L 65  AST 0 - 40 IU/L 18  ALT 0 - 32 IU/L 15   Edinburgh Score: Edinburgh Postnatal Depression Scale Screening Tool 09/03/2020  I have been able to laugh and see the funny side of things. (No Data)      After visit meds:     Discharge home in stable condition Infant Feeding: Breast Infant Disposition:home with mother Discharge instruction: per After Visit Summary and Postpartum booklet. Activity: Advance as tolerated. Pelvic rest for 6 weeks.  Diet: routine diet Anticipated  Birth Control: Unsure Postpartum Appointment:6 weeks Additional Postpartum F/U:    Future Appointments:No future appointments. Follow up Visit:      09/04/2020 Luz Lex, MD

## 2020-09-05 ENCOUNTER — Encounter (HOSPITAL_COMMUNITY): Payer: Self-pay | Admitting: Obstetrics and Gynecology

## 2020-09-05 DIAGNOSIS — R110 Nausea: Secondary | ICD-10-CM | POA: Diagnosis not present

## 2020-09-05 DIAGNOSIS — Z882 Allergy status to sulfonamides status: Secondary | ICD-10-CM | POA: Diagnosis not present

## 2020-09-05 DIAGNOSIS — R5383 Other fatigue: Secondary | ICD-10-CM | POA: Diagnosis not present

## 2020-09-05 DIAGNOSIS — Z885 Allergy status to narcotic agent status: Secondary | ICD-10-CM | POA: Diagnosis not present

## 2020-09-05 DIAGNOSIS — Z87891 Personal history of nicotine dependence: Secondary | ICD-10-CM | POA: Diagnosis not present

## 2020-09-05 DIAGNOSIS — Z8249 Family history of ischemic heart disease and other diseases of the circulatory system: Secondary | ICD-10-CM | POA: Diagnosis not present

## 2020-09-05 DIAGNOSIS — R5382 Chronic fatigue, unspecified: Secondary | ICD-10-CM

## 2020-09-05 DIAGNOSIS — Z98891 History of uterine scar from previous surgery: Secondary | ICD-10-CM | POA: Diagnosis not present

## 2020-09-05 DIAGNOSIS — O99893 Other specified diseases and conditions complicating puerperium: Secondary | ICD-10-CM

## 2020-09-05 DIAGNOSIS — R42 Dizziness and giddiness: Secondary | ICD-10-CM | POA: Diagnosis not present

## 2020-09-05 DIAGNOSIS — Z888 Allergy status to other drugs, medicaments and biological substances status: Secondary | ICD-10-CM | POA: Diagnosis not present

## 2020-09-05 LAB — CBC WITH DIFFERENTIAL/PLATELET
Abs Immature Granulocytes: 0.29 10*3/uL — ABNORMAL HIGH (ref 0.00–0.07)
Basophils Absolute: 0 10*3/uL (ref 0.0–0.1)
Basophils Relative: 0 %
Eosinophils Absolute: 0.1 10*3/uL (ref 0.0–0.5)
Eosinophils Relative: 1 %
HCT: 31 % — ABNORMAL LOW (ref 36.0–46.0)
Hemoglobin: 10.7 g/dL — ABNORMAL LOW (ref 12.0–15.0)
Immature Granulocytes: 2 %
Lymphocytes Relative: 10 %
Lymphs Abs: 1.5 10*3/uL (ref 0.7–4.0)
MCH: 30.9 pg (ref 26.0–34.0)
MCHC: 34.5 g/dL (ref 30.0–36.0)
MCV: 89.6 fL (ref 80.0–100.0)
Monocytes Absolute: 0.7 10*3/uL (ref 0.1–1.0)
Monocytes Relative: 4 %
Neutro Abs: 12.8 10*3/uL — ABNORMAL HIGH (ref 1.7–7.7)
Neutrophils Relative %: 83 %
Platelets: 264 10*3/uL (ref 150–400)
RBC: 3.46 MIL/uL — ABNORMAL LOW (ref 3.87–5.11)
RDW: 14.7 % (ref 11.5–15.5)
WBC: 15.4 10*3/uL — ABNORMAL HIGH (ref 4.0–10.5)
nRBC: 0 % (ref 0.0–0.2)

## 2020-09-05 LAB — SURGICAL PATHOLOGY

## 2020-09-05 NOTE — MAU Note (Signed)
PT SAYS HAD C/S ON 8-6 WENT HOME ON Monday- ALL WAS OK THEN AT 6PM- STARTED FEELING LIGHT HEADED- TOOK NAP WAS TAKING STOOL SOFTENERS- NOW DIARRHEA-SUPER  LOOSE STOOL THEN NAUSEA- NO VOMITING  NO FEVER NOW- SEES SPOTS- BP WAS OK

## 2020-09-05 NOTE — MAU Provider Note (Addendum)
History     CSN: [id number]  Arrival date and time: 09/04/20 2233   Event Date/Time   First Provider Initiated Contact with Patient 09/05/20 0201      Chief Complaint  Patient presents with   Fatigue   Ms. Brittany Collins is a 30 y.o. year old G11P1001 female at 3 days s/p pCS for IUGR who presents to MAU reporting she had was d/c'd Monday 8/8 and "everything was okay." She reports feeling light-headed around 1800 today. She took a nap., but when she got up she still felt light-headed and nauseated. She has not vomited. She also complains of "super loose " stool, but thinks it is coming from all the stool softeners she was given while in the hospital. She occasionally see "floaters" in vision. She denies any problems with BP during pregnancy or while in hospital. She reports "feeling a little better" on the way here, but decided to continue coming to the hospital. She thought maybe she was dehydrated "or something." She recalls eating a bagel this AM and "bites" of a sandwich at around 1900 and not much to drink today. She is breastfeeding/pumping. She received [facility] with [facility]. Her spouse is present and contributing to the history taking.   OB History as of 09/02/2020     Gravida  1   Para  1   Term  1   Preterm      AB      Living  1      SAB      IAB      Ectopic      Multiple  0   Live Births  1           Past Medical History:  Diagnosis Date   ADHD    Allergic rhinitis    Anxiety    Chicken pox    Chronic pansinusitis    GERD (gastroesophageal reflux disease)    Headache    History of kidney stones    Migraines     Past Surgical History:  Procedure Laterality Date   CESAREAN SECTION N/A 09/02/2020   Procedure: PRIMARY CESAREAN SECTION EDC: 09-05-20 ALLERG: SULFA, TRAMADOL;  Surgeon: Marcelle Overlie, MD;  Location: [facility] LD ORS;  Service: Obstetrics;  Laterality: N/A;   WISDOM TOOTH EXTRACTION      Family History  Problem Relation Age  of Onset   Arthritis Mother    Hypertension Mother    Sleep apnea Mother    Heart disease Father    Heart disease Maternal Grandmother    Diabetes Maternal Grandmother    Sleep apnea Maternal Grandmother    Heart disease Maternal Grandfather    Diabetes Maternal Grandfather    Stroke Paternal Grandfather     Social History   Tobacco Use   Smoking status: Former    Packs/day: 1.00    Years: 7.00    Pack years: 7.00    Types: Cigarettes   Smokeless tobacco: Never  Vaping Use   Vaping Use: Never used  Substance Use Topics   Alcohol use: Yes    Comment: <1 per month   Drug use: No    Allergies:  Allergies  Allergen Reactions   Tramadol Other (See Comments)    headache migraines    Sulfa Antibiotics Other (See Comments)    Reaction unknown   Sulfamethoxazole     Other reaction(s): Confusion (intolerance)    Medications Prior to Admission  Medication Sig Dispense Refill Last Dose  calcium carbonate (TUMS EX) 750 MG chewable tablet Chew 1 tablet by mouth daily as needed for heartburn.      ibuprofen (ADVIL) 600 MG tablet Take 1 tablet (600 mg total) by mouth every 6 (six) hours as needed. 30 tablet 0    oxyCODONE (OXY IR/ROXICODONE) 5 MG immediate release tablet Take 1-2 tablets (5-10 mg total) by mouth every 4 (four) hours as needed for moderate pain. 20 tablet 0    Prenatal Vit-Fe Fumarate-FA (PRENATAL MULTIVITAMIN) TABS tablet Take 1 tablet by mouth daily at 12 noon.       Review of Systems  Constitutional:  Positive for appetite change (haven't eaten much today).  HENT: Negative.    Eyes:  Positive for visual disturbance ("occasional seeing floaters").  Respiratory: Negative.    Cardiovascular: Negative.   Gastrointestinal: Negative.   Endocrine: Negative.   Genitourinary: Negative.   Musculoskeletal: Negative.   Skin:  Positive for wound (C/S).  Allergic/Immunologic: Negative.   Neurological:  Positive for dizziness (not now).  Hematological: Negative.    Psychiatric/Behavioral: Negative.    Physical Exam   Blood pressure 132/72, pulse 62, temperature 98 F (36.7 C), temperature source Oral, resp. rate 20, height 5\' 4"  (1.626 m), weight 76.7 kg, last menstrual period 11/30/2019, currently breastfeeding.  Physical Exam Vitals and nursing note reviewed.  Constitutional:      Appearance: Normal appearance. She is normal weight.  Cardiovascular:     Rate and Rhythm: Normal rate.  Pulmonary:     Effort: Pulmonary effort is normal.  Genitourinary:    Comments: Not indicated Musculoskeletal:        General: Normal range of motion.     Cervical back: Normal range of motion.  Skin:    General: Skin is warm and dry.  Neurological:     Mental Status: She is alert and oriented to person, place, and time.  Psychiatric:        Mood and Affect: Mood normal.        Behavior: Behavior normal.        Thought Content: Thought content normal.        Judgment: Judgment normal.    MAU Course  Procedures  MDM CBC w/Diff  Results for orders placed or performed during the hospital encounter of 09/04/20 (from the past 24 hour(s))  CBC with Differential/Platelet     Status: Abnormal   Collection Time: 09/05/20 12:25 AM  Result Value Ref Range   WBC 15.4 (H) 4.0 - 10.5 K/uL   RBC 3.46 (L) 3.87 - 5.11 MIL/uL   Hemoglobin 10.7 (L) 12.0 - 15.0 g/dL   HCT 11/05/20 (L) 57.3 - 22.0 %   MCV 89.6 80.0 - 100.0 fL   MCH 30.9 26.0 - 34.0 pg   MCHC 34.5 30.0 - 36.0 g/dL   RDW 25.4 27.0 - 62.3 %   Platelets 264 150 - 400 K/uL   nRBC 0.0 0.0 - 0.2 %   Neutrophils Relative % 83 %   Neutro Abs 12.8 (H) 1.7 - 7.7 K/uL   Lymphocytes Relative 10 %   Lymphs Abs 1.5 0.7 - 4.0 K/uL   Monocytes Relative 4 %   Monocytes Absolute 0.7 0.1 - 1.0 K/uL   Eosinophils Relative 1 %   Eosinophils Absolute 0.1 0.0 - 0.5 K/uL   Basophils Relative 0 %   Basophils Absolute 0.0 0.0 - 0.1 K/uL   Immature Granulocytes 2 %   Abs Immature Granulocytes 0.29 (H) 0.00 - 0.07 K/uL  Assessment and Plan  Status post cesarean delivery - Plan: Discharge patient  - Information provided on protein contents in food and eating plan for breastfeeding women - Educated on normal healing process after surgery - Reviewed wound healing process and what normal incision healing - Discussed CBC results  - Discharge home - Call to schedule 6 weeks PP visit  - Patient verbalized an understanding of the plan of care and agrees.    Raelyn Mora, CNM 09/05/2020, 2:01 AM

## 2020-09-05 NOTE — Discharge Instructions (Signed)
DRINK AT LEAST 1/2 to 1 GALLON OF WATER DAILY

## 2020-09-16 ENCOUNTER — Telehealth (HOSPITAL_COMMUNITY): Payer: Self-pay | Admitting: *Deleted

## 2020-09-16 NOTE — Telephone Encounter (Signed)
Attempted Hospital Discharge Follow-Up Call.  Left message requesting that patient return RN's phone call.

## 2020-09-26 DIAGNOSIS — F900 Attention-deficit hyperactivity disorder, predominantly inattentive type: Secondary | ICD-10-CM | POA: Diagnosis not present

## 2020-09-26 DIAGNOSIS — F605 Obsessive-compulsive personality disorder: Secondary | ICD-10-CM | POA: Diagnosis not present

## 2020-10-09 DIAGNOSIS — Z1389 Encounter for screening for other disorder: Secondary | ICD-10-CM | POA: Diagnosis not present

## 2020-11-14 DIAGNOSIS — F900 Attention-deficit hyperactivity disorder, predominantly inattentive type: Secondary | ICD-10-CM | POA: Diagnosis not present

## 2020-11-14 DIAGNOSIS — F605 Obsessive-compulsive personality disorder: Secondary | ICD-10-CM | POA: Diagnosis not present

## 2021-02-22 DIAGNOSIS — R300 Dysuria: Secondary | ICD-10-CM | POA: Diagnosis not present

## 2021-09-09 IMAGING — US US MFM OB LIMITED
1 series · 14 of 28 positions shown · non-contrast
Comparison: none

[Series 1: us mfm ob limited · 14 of 76 slices shown]
[im 3/76]
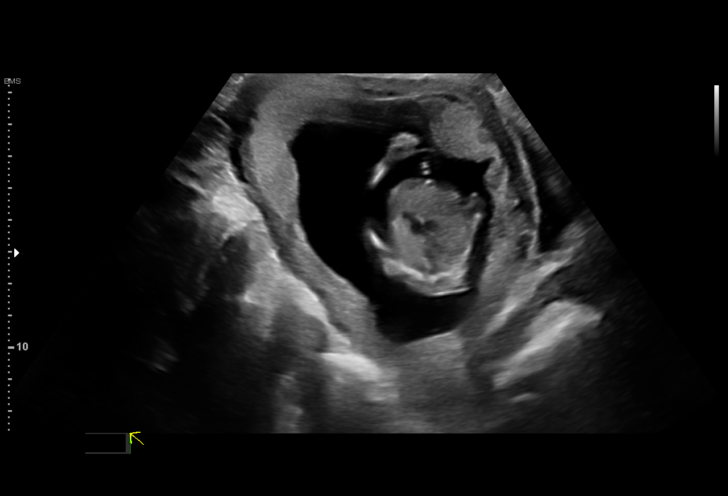
[im 9/76]
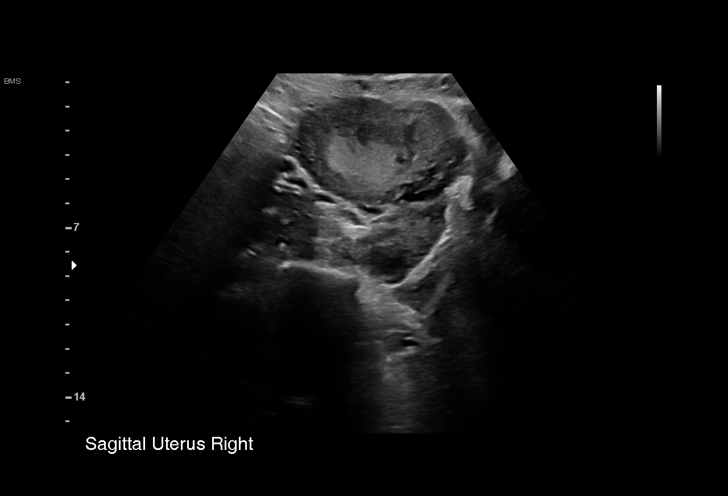
[im 14/76]
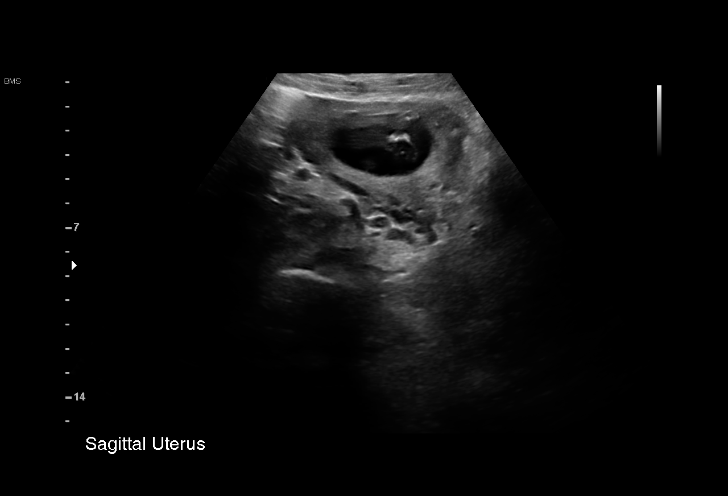
[im 20/76]
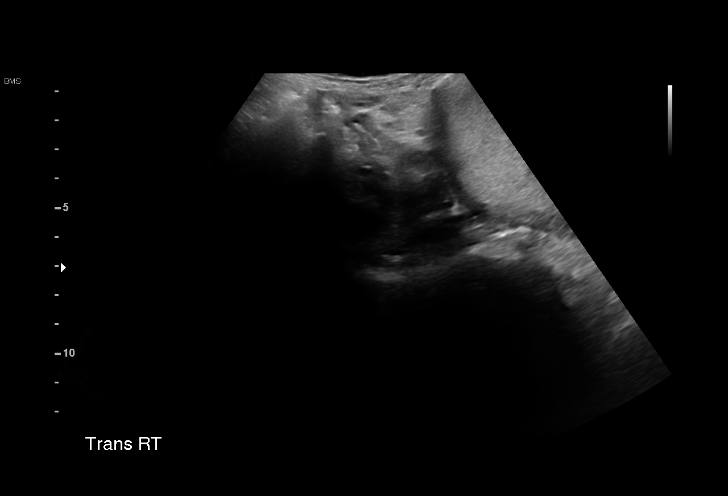
[im 26/76]
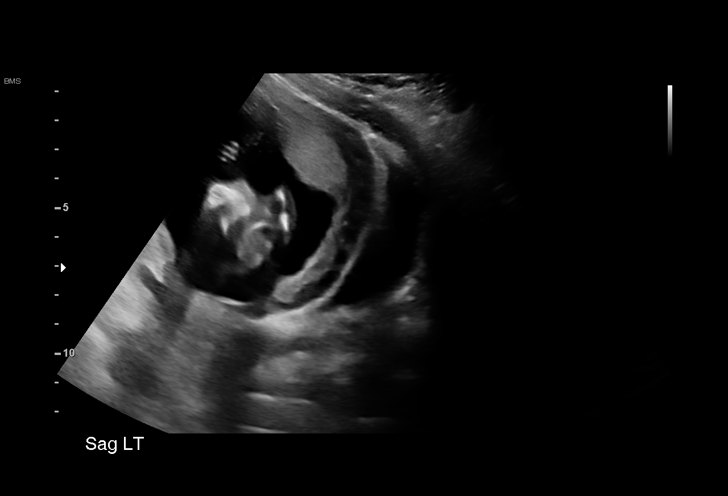
[im 31/76]
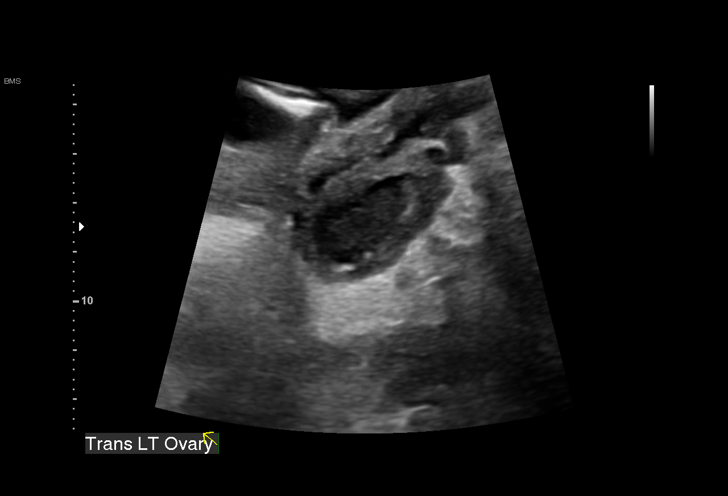
[im 37/76]
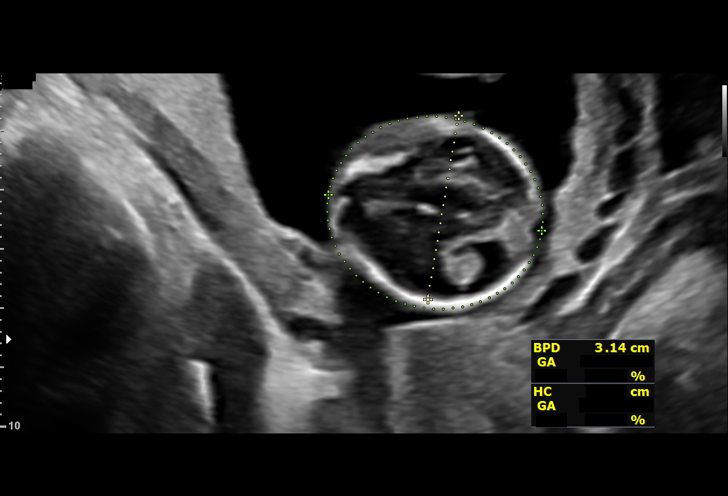
[im 42/76]
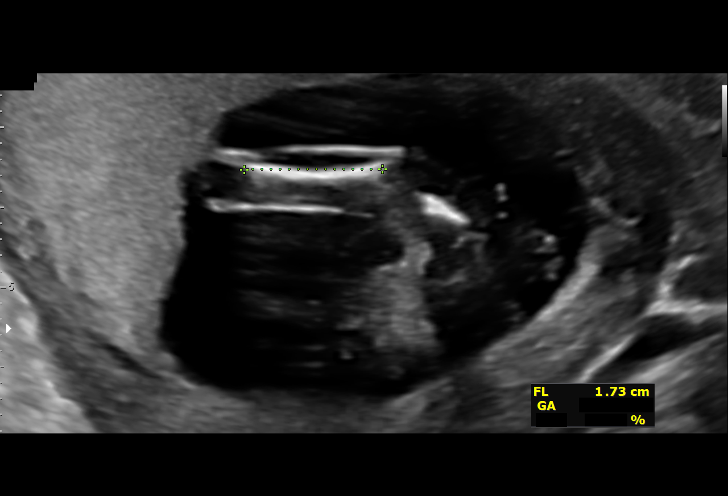
[im 48/76]
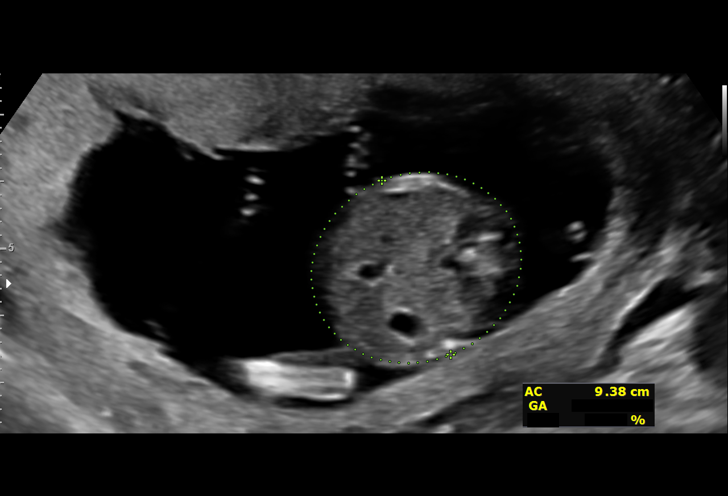
[im 53/76]
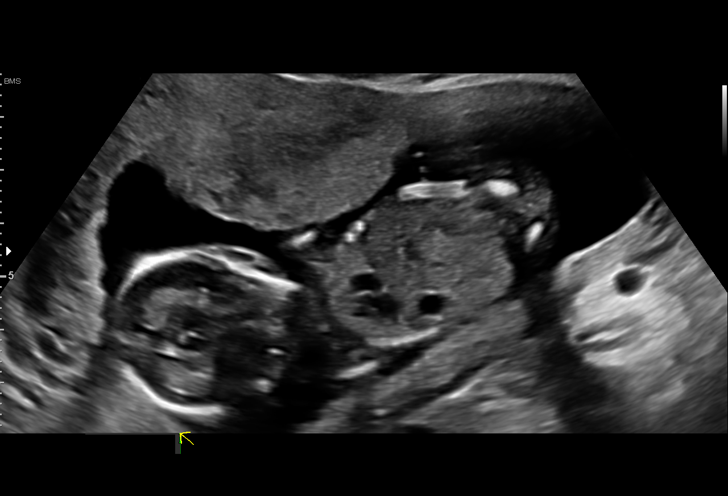
[im 59/76]
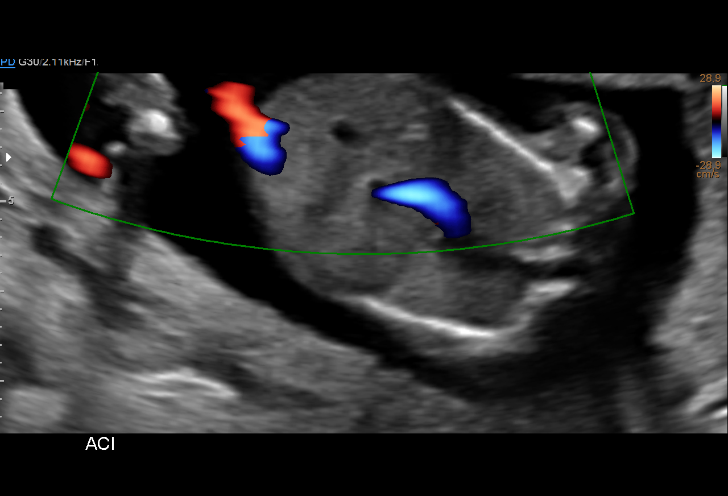
[im 64/76]
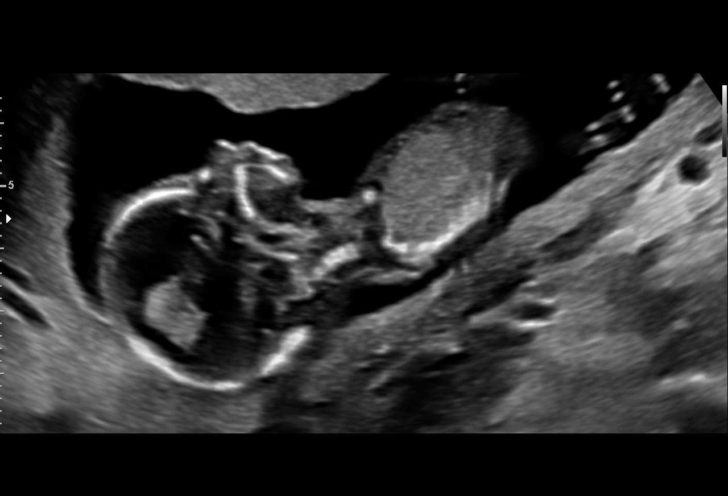
[im 70/76]
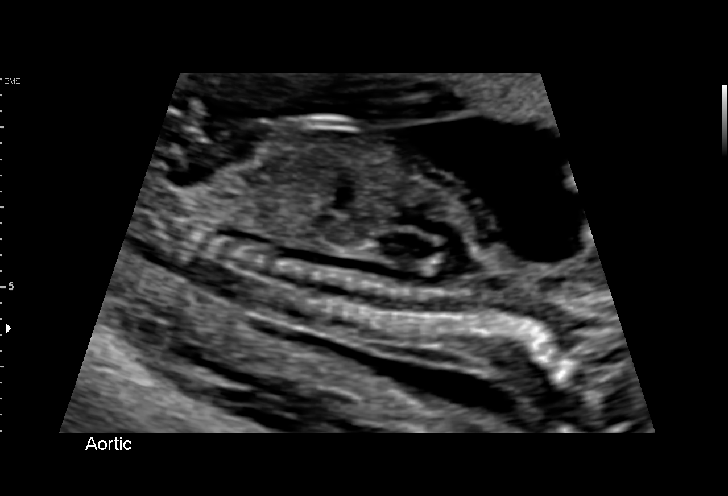
[im 76/76]
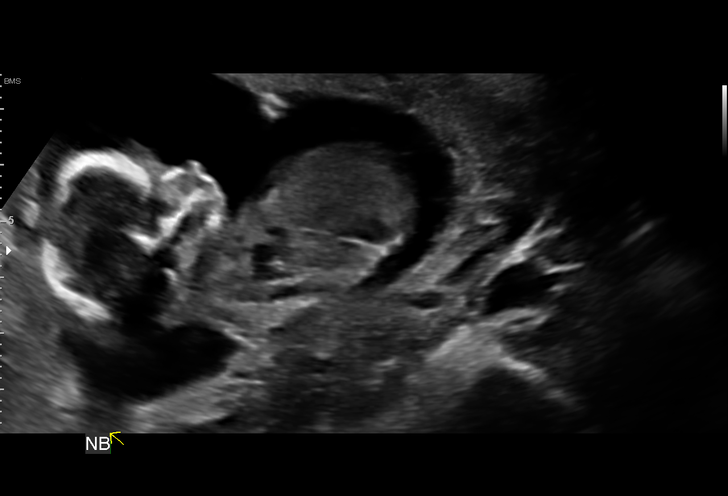

[14 of 28 positions shown; findings below may reference images not displayed]

1  US MFM OB LIMITED                     76815.01    HALE BORMAN

Indications

 14 weeks gestation of pregnancy
 Encounter for antenatal screening for
 malformations
 Abnormal chromosomal and genetic finding
 on antenatal screening of mother (Abnormal
 NIPS FF9.7, XXY)
 Medical complication of pregnancy
 (depression, anxiety, ovarian cyst)
Fetal Evaluation

 Num Of Fetuses:         1
 Fetal Heart Rate(bpm):  150
 Cardiac Activity:       Observed
 Presentation:           Cephalic

 Amniotic Fluid
 AFI FV:      Within normal limits
Biometry

 CRL:        89  mm     G. Age:  N/A                     EDD:

 BPD:      31.5  mm     G. Age:  15w 6d                  CI:        83.33   %    70 - 86
                                                         FL/HC:      15.5   %    15.3 -
 HC:      108.8  mm     G. Age:  15w 2d                  HC/AC:      1.17        1.05 -
 AC:       93.1  mm     G. Age:  15w 3d                  FL/BPD:     53.7   %
 FL:       16.9  mm     G. Age:  15w 0d                  FL/AC:      18.2   %    20 - 24
 LV:        5.3  mm
 Est. FW:     119  gm      0 lb 4 oz
OB History

 Gravidity:    1
 Living:       0
Gestational Age

 LMP:           14w 6d        Date:  11/30/19                 EDD:   09/05/20
 U/S Today:     15w 3d                                        EDD:   09/01/20
 Best:          14w 6d     Det. By:  LMP  (11/30/19)          EDD:   09/05/20
Anatomy

 Ventricles:            Appears normal         Stomach:                Appears normal, left
                                                                       sided
 Choroid Plexus:        Appears normal         Bladder:                Appears normal
 Aortic Arch:           Visualized             Upper Extremities:      Visualized
 Diaphragm:             Appears normal         Lower Extremities:      Visualized
Cervix Uterus Adnexa

 Cervix
 Length:           3.41  cm.
 Normal appearance by transabdominal scan.

 Uterus
 No abnormality visualized.

 Right Ovary
 Not visualized.

 Left Ovary
 Within normal limits.

 Cul De Sac
 No free fluid seen.

 Adnexa
 No adnexal mass visualized.
Impression

 Ms. Rhodes, Tiara1 P0, is here for ultrasound and genetic
 counseling. On cell-free fetal DNA screening, sex-
 chromosomal aneuploidy was suspected (47, XXY).
 A limited ultrasound study was performed. Fetal biometry is
 consistent with her previously established dates and good
 fetal activity is seen. Amniotic fluid is normal and good fetal
 activity is seen. No obvious fetal structural defects are seen.
 Detailed fetal anatomical survey was not performed because
 of early gestational age. Fetal sex appears male.
 I briefly explained that only amniocentesis will give a
 definitive result on the fetal karyotype. I explained the
 procedure and possible complications including miscarriage
 (1 in 500 procedures).
 The couple met with our genetic counselor after ultrasound
 and the patient opted not to have amniocentesis. You will be
 receiving a separate letter from our genetic counselor.
Recommendations

 -An appointment was made for her to return in 4 weeks for
 fetal anatomy scan.
                 Pertey, Anolyi

## 2022-05-01 LAB — OB RESULTS CONSOLE RPR: RPR: NONREACTIVE

## 2022-05-01 LAB — HEPATITIS C ANTIBODY: HCV Ab: NEGATIVE

## 2022-05-01 LAB — OB RESULTS CONSOLE HEPATITIS B SURFACE ANTIGEN: Hepatitis B Surface Ag: NEGATIVE

## 2022-05-01 LAB — OB RESULTS CONSOLE RUBELLA ANTIBODY, IGM: Rubella: IMMUNE

## 2022-05-01 LAB — OB RESULTS CONSOLE HIV ANTIBODY (ROUTINE TESTING): HIV: NONREACTIVE

## 2022-05-13 LAB — OB RESULTS CONSOLE GC/CHLAMYDIA
Chlamydia: NEGATIVE
Neisseria Gonorrhea: NEGATIVE

## 2022-11-08 ENCOUNTER — Encounter (HOSPITAL_COMMUNITY): Payer: Self-pay

## 2022-11-08 ENCOUNTER — Telehealth (HOSPITAL_COMMUNITY): Payer: Self-pay | Admitting: *Deleted

## 2022-11-08 ENCOUNTER — Encounter (HOSPITAL_COMMUNITY): Payer: Self-pay | Admitting: *Deleted

## 2022-11-08 NOTE — Telephone Encounter (Signed)
Preadmission screen  

## 2022-11-08 NOTE — Patient Instructions (Addendum)
Brittany Collins  11/08/2022   Your procedure is scheduled on:  11/14/2022  Arrive at 0530 at Entrance C on [address] at [facility]  and [address]. You are invited to use the FREE valet parking or use the Visitor's parking deck.  Pick up the phone at the desk and dial [phone].  Call this number if you have problems the morning of surgery: [phone]  Remember:   Do not eat food:(After Midnight) Desps de medianoche.  Do not drink clear liquids: (After Midnight) Desps de medianoche.  Take these medicines the morning of surgery with A SIP OF WATER:  Prozac   Do not wear jewelry, make-up or nail polish.  Do not wear lotions, powders, or perfumes. Do not wear deodorant.  Do not shave 48 hours prior to surgery.  Do not bring valuables to the hospital.  [facility] is not   responsible for any belongings or valuables brought to the hospital.  Contacts, dentures or bridgework may not be worn into surgery.  Leave suitcase in the car. After surgery it may be brought to your room.  For patients admitted to the hospital, checkout time is 11:00 AM the day of              discharge.      Please read over the following fact sheets that you were given:     Preparing for Surgery

## 2022-11-11 ENCOUNTER — Encounter (HOSPITAL_COMMUNITY): Payer: Self-pay

## 2022-11-11 ENCOUNTER — Telehealth (HOSPITAL_COMMUNITY): Payer: Self-pay | Admitting: *Deleted

## 2022-11-11 NOTE — Telephone Encounter (Signed)
Preadmission screen  

## 2022-11-12 ENCOUNTER — Encounter (HOSPITAL_COMMUNITY)
Admission: RE | Admit: 2022-11-12 | Discharge: 2022-11-12 | Disposition: A | Discharge status: Home / Self Care | Payer: Commercial Managed Care - PPO | Source: Ambulatory Visit | Attending: Obstetrics and Gynecology | Admitting: Obstetrics and Gynecology

## 2022-11-12 DIAGNOSIS — Z01812 Encounter for preprocedural laboratory examination: Secondary | ICD-10-CM | POA: Insufficient documentation

## 2022-11-12 DIAGNOSIS — O365930 Maternal care for other known or suspected poor fetal growth, third trimester, not applicable or unspecified: Secondary | ICD-10-CM | POA: Insufficient documentation

## 2022-11-12 LAB — CBC
HCT: 34.6 % — ABNORMAL LOW (ref 36.0–46.0)
Hemoglobin: 11.3 g/dL — ABNORMAL LOW (ref 12.0–15.0)
MCH: 28 pg (ref 26.0–34.0)
MCHC: 32.7 g/dL (ref 30.0–36.0)
MCV: 85.9 fL (ref 80.0–100.0)
Platelets: 284 10*3/uL (ref 150–400)
RBC: 4.03 MIL/uL (ref 3.87–5.11)
RDW: 17.2 % — ABNORMAL HIGH (ref 11.5–15.5)
WBC: 13.5 10*3/uL — ABNORMAL HIGH (ref 4.0–10.5)
nRBC: 0 % (ref 0.0–0.2)

## 2022-11-12 LAB — TYPE AND SCREEN
ABO/RH(D): A POS
Antibody Screen: NEGATIVE

## 2022-11-13 ENCOUNTER — Telehealth (HOSPITAL_COMMUNITY): Payer: Self-pay | Admitting: *Deleted

## 2022-11-13 LAB — RPR: RPR Ser Ql: NONREACTIVE

## 2022-11-13 NOTE — Telephone Encounter (Signed)
Message left with new instructions of drinking clear liquids up until time of arrival for CS.  Included a description of what clear liquid included.

## 2022-11-13 NOTE — Anesthesia Preprocedure Evaluation (Signed)
Anesthesia Evaluation  Patient identified by MRN, date of birth, ID band Patient awake    Reviewed: Allergy & Precautions, NPO status , Patient's Chart, lab work & pertinent test results  Airway Mallampati: II  TM Distance: >3 FB Neck ROM: Full    Dental no notable dental hx.    Pulmonary former smoker   Pulmonary exam normal        Cardiovascular negative cardio ROS  Rhythm:Regular Rate:Normal     Neuro/Psych  Headaches  Anxiety        GI/Hepatic Neg liver ROS,GERD  Medicated,,  Endo/Other  negative endocrine ROS    Renal/GU negative Renal ROS  negative genitourinary   Musculoskeletal negative musculoskeletal ROS (+)    Abdominal Normal abdominal exam  (+)   Peds  Hematology Lab Results      Component                Value               Date                      WBC                      13.5 (H)            11/12/2022                HGB                      11.3 (L)            11/12/2022                HCT                      34.6 (L)            11/12/2022                MCV                      85.9                11/12/2022                PLT                      284                 11/12/2022             Lab Results      Component                Value               Date                      NA                       140                 05/25/2019                K                        4.1  05/25/2019                CO2                      23                  05/25/2019                GLUCOSE                  84                  05/25/2019                BUN                      12                  05/25/2019                CREATININE               0.78                05/25/2019                CALCIUM                  9.2                 05/25/2019                GFRNONAA                 103                 05/25/2019              Anesthesia Other Findings   Reproductive/Obstetrics (+)  Pregnancy                             Anesthesia Physical Anesthesia Plan  ASA: 2  Anesthesia Plan: Spinal   Post-op Pain Management:    Induction:   PONV Risk Score and Plan: 2 and Ondansetron and Dexamethasone  Airway Management Planned: Natural Airway  Additional Equipment: None  Intra-op Plan:   Post-operative Plan:   Informed Consent: I have reviewed the patients History and Physical, chart, labs and discussed the procedure including the risks, benefits and alternatives for the proposed anesthesia with the patient or authorized representative who has indicated his/her understanding and acceptance.     Dental advisory given  Plan Discussed with: CRNA  Anesthesia Plan Comments:        Anesthesia Quick Evaluation

## 2022-11-14 ENCOUNTER — Inpatient Hospital Stay (HOSPITAL_COMMUNITY): Payer: Commercial Managed Care - PPO | Admitting: Anesthesiology

## 2022-11-14 ENCOUNTER — Other Ambulatory Visit: Payer: Self-pay

## 2022-11-14 ENCOUNTER — Encounter (HOSPITAL_COMMUNITY): Payer: Self-pay | Admitting: Obstetrics and Gynecology

## 2022-11-14 ENCOUNTER — Encounter (HOSPITAL_COMMUNITY)
Admission: RE | Disposition: A | Discharge status: Home / Self Care | Payer: Self-pay | Source: Home / Self Care | Attending: Obstetrics and Gynecology

## 2022-11-14 ENCOUNTER — Inpatient Hospital Stay (HOSPITAL_COMMUNITY)
Admission: RE | Admit: 2022-11-14 | Discharge: 2022-11-16 | DRG: 788 | Disposition: A | Discharge status: Home / Self Care | Payer: Commercial Managed Care - PPO | Attending: Obstetrics and Gynecology | Admitting: Obstetrics and Gynecology

## 2022-11-14 DIAGNOSIS — Z3A37 37 weeks gestation of pregnancy: Secondary | ICD-10-CM

## 2022-11-14 DIAGNOSIS — Z8249 Family history of ischemic heart disease and other diseases of the circulatory system: Secondary | ICD-10-CM

## 2022-11-14 DIAGNOSIS — O9962 Diseases of the digestive system complicating childbirth: Secondary | ICD-10-CM | POA: Diagnosis present

## 2022-11-14 DIAGNOSIS — Z833 Family history of diabetes mellitus: Secondary | ICD-10-CM | POA: Diagnosis not present

## 2022-11-14 DIAGNOSIS — Z8261 Family history of arthritis: Secondary | ICD-10-CM

## 2022-11-14 DIAGNOSIS — Z823 Family history of stroke: Secondary | ICD-10-CM | POA: Diagnosis not present

## 2022-11-14 DIAGNOSIS — Z87442 Personal history of urinary calculi: Secondary | ICD-10-CM | POA: Diagnosis not present

## 2022-11-14 DIAGNOSIS — O34211 Maternal care for low transverse scar from previous cesarean delivery: Secondary | ICD-10-CM

## 2022-11-14 DIAGNOSIS — K219 Gastro-esophageal reflux disease without esophagitis: Secondary | ICD-10-CM | POA: Diagnosis present

## 2022-11-14 DIAGNOSIS — O365930 Maternal care for other known or suspected poor fetal growth, third trimester, not applicable or unspecified: Principal | ICD-10-CM | POA: Diagnosis present

## 2022-11-14 DIAGNOSIS — Z98891 History of uterine scar from previous surgery: Secondary | ICD-10-CM

## 2022-11-14 DIAGNOSIS — O365990 Maternal care for other known or suspected poor fetal growth, unspecified trimester, not applicable or unspecified: Secondary | ICD-10-CM | POA: Diagnosis present

## 2022-11-14 DIAGNOSIS — Z87891 Personal history of nicotine dependence: Secondary | ICD-10-CM | POA: Diagnosis not present

## 2022-11-14 LAB — HIV ANTIBODY (ROUTINE TESTING W REFLEX): HIV Screen 4th Generation wRfx: NONREACTIVE

## 2022-11-14 SURGERY — Surgical Case
Anesthesia: Spinal

## 2022-11-14 MED ORDER — SCOPOLAMINE 1 MG/3DAYS TD PT72: 1.0000 | MEDICATED_PATCH | Freq: Once | TRANSDERMAL | Status: DC

## 2022-11-14 MED ORDER — SIMETHICONE 80 MG PO CHEW
80.0000 mg | CHEWABLE_TABLET | ORAL | Status: DC | PRN
  Administered 2022-11-14: 80 mg via ORAL
  Filled 2022-11-14: qty 1

## 2022-11-14 MED ORDER — ONDANSETRON HCL 4 MG/2ML IJ SOLN: 4.0000 mg | Freq: Three times a day (TID) | INTRAMUSCULAR | Status: DC | PRN

## 2022-11-14 MED ORDER — SODIUM CHLORIDE 0.9 % IR SOLN
Status: DC | PRN
  Administered 2022-11-14: 1

## 2022-11-14 MED ORDER — POVIDONE-IODINE 10 % EX SWAB
2.0000 | Freq: Once | CUTANEOUS | Status: AC
  Administered 2022-11-14: 2 via TOPICAL

## 2022-11-14 MED ORDER — DIPHENHYDRAMINE HCL 25 MG PO CAPS: 25.0000 mg | ORAL_CAPSULE | ORAL | Status: DC | PRN

## 2022-11-14 MED ORDER — MENTHOL 3 MG MT LOZG: 1.0000 | LOZENGE | OROMUCOSAL | Status: DC | PRN

## 2022-11-14 MED ORDER — SENNOSIDES-DOCUSATE SODIUM 8.6-50 MG PO TABS
2.0000 | ORAL_TABLET | Freq: Every day | ORAL | Status: DC
  Administered 2022-11-15: 2 via ORAL
  Filled 2022-11-14 (×2): qty 2

## 2022-11-14 MED ORDER — DEXAMETHASONE SODIUM PHOSPHATE 10 MG/ML IJ SOLN
INTRAMUSCULAR | Status: DC | PRN
  Administered 2022-11-14: 8 mg via INTRAVENOUS

## 2022-11-14 MED ORDER — DIPHENHYDRAMINE HCL 25 MG PO CAPS: 25.0000 mg | ORAL_CAPSULE | Freq: Four times a day (QID) | ORAL | Status: DC | PRN

## 2022-11-14 MED ORDER — ZOLPIDEM TARTRATE 5 MG PO TABS: 5.0000 mg | ORAL_TABLET | Freq: Every evening | ORAL | Status: DC | PRN

## 2022-11-14 MED ORDER — MORPHINE SULFATE (PF) 0.5 MG/ML IJ SOLN
INTRAMUSCULAR | Status: AC
  Filled 2022-11-14: qty 10

## 2022-11-14 MED ORDER — WITCH HAZEL-GLYCERIN EX PADS: 1.0000 | MEDICATED_PAD | CUTANEOUS | Status: DC | PRN

## 2022-11-14 MED ORDER — DEXAMETHASONE SODIUM PHOSPHATE 4 MG/ML IJ SOLN
INTRAMUSCULAR | Status: AC
  Filled 2022-11-14: qty 2

## 2022-11-14 MED ORDER — PRENATAL MULTIVITAMIN CH
1.0000 | ORAL_TABLET | Freq: Every day | ORAL | Status: DC
  Administered 2022-11-14 – 2022-11-16 (×3): 1 via ORAL
  Filled 2022-11-14 (×3): qty 1

## 2022-11-14 MED ORDER — BUPIVACAINE IN DEXTROSE 0.75-8.25 % IT SOLN
INTRATHECAL | Status: DC | PRN
  Administered 2022-11-14: 1.4 mL via INTRATHECAL

## 2022-11-14 MED ORDER — OXYTOCIN-SODIUM CHLORIDE 30-0.9 UT/500ML-% IV SOLN
INTRAVENOUS | Status: DC | PRN
  Administered 2022-11-14: 400 mL via INTRAVENOUS

## 2022-11-14 MED ORDER — FENTANYL CITRATE (PF) 100 MCG/2ML IJ SOLN
INTRAMUSCULAR | Status: DC | PRN
  Administered 2022-11-14: 15 ug via INTRATHECAL

## 2022-11-14 MED ORDER — OXYCODONE HCL 5 MG PO TABS: 5.0000 mg | ORAL_TABLET | ORAL | Status: DC | PRN

## 2022-11-14 MED ORDER — PHENYLEPHRINE HCL-NACL 20-0.9 MG/250ML-% IV SOLN
INTRAVENOUS | Status: AC
  Filled 2022-11-14: qty 250

## 2022-11-14 MED ORDER — SODIUM CHLORIDE 0.9 % IV SOLN
2.0000 g | INTRAVENOUS | Status: AC
  Administered 2022-11-14: 2 g via INTRAVENOUS

## 2022-11-14 MED ORDER — SODIUM CHLORIDE 0.9 % IV SOLN
INTRAVENOUS | Status: AC
  Filled 2022-11-14: qty 2

## 2022-11-14 MED ORDER — COCONUT OIL OIL
1.0000 | TOPICAL_OIL | Status: DC | PRN
  Administered 2022-11-15: 1 via TOPICAL

## 2022-11-14 MED ORDER — SIMETHICONE 80 MG PO CHEW
80.0000 mg | CHEWABLE_TABLET | Freq: Three times a day (TID) | ORAL | Status: DC
  Administered 2022-11-14 – 2022-11-15 (×5): 80 mg via ORAL
  Filled 2022-11-14 (×7): qty 1

## 2022-11-14 MED ORDER — ONDANSETRON HCL 4 MG/2ML IJ SOLN
INTRAMUSCULAR | Status: AC
  Filled 2022-11-14: qty 2

## 2022-11-14 MED ORDER — ACETAMINOPHEN 10 MG/ML IV SOLN
INTRAVENOUS | Status: DC | PRN
  Administered 2022-11-14: 1000 mg via INTRAVENOUS

## 2022-11-14 MED ORDER — SODIUM CHLORIDE 0.9% FLUSH: 3.0000 mL | INTRAVENOUS | Status: DC | PRN

## 2022-11-14 MED ORDER — IBUPROFEN 600 MG PO TABS
600.0000 mg | ORAL_TABLET | Freq: Four times a day (QID) | ORAL | Status: DC
  Administered 2022-11-14 – 2022-11-16 (×9): 600 mg via ORAL
  Filled 2022-11-14 (×9): qty 1

## 2022-11-14 MED ORDER — NALOXONE HCL 4 MG/10ML IJ SOLN: 1.0000 ug/kg/h | INTRAVENOUS | Status: DC | PRN

## 2022-11-14 MED ORDER — FENTANYL CITRATE (PF) 100 MCG/2ML IJ SOLN
INTRAMUSCULAR | Status: AC
  Filled 2022-11-14: qty 2

## 2022-11-14 MED ORDER — STERILE WATER FOR IRRIGATION IR SOLN
Status: DC | PRN
  Administered 2022-11-14: 1

## 2022-11-14 MED ORDER — DEXMEDETOMIDINE HCL IN NACL 80 MCG/20ML IV SOLN
INTRAVENOUS | Status: DC | PRN
  Administered 2022-11-14: 8 ug via INTRAVENOUS

## 2022-11-14 MED ORDER — OXYTOCIN-SODIUM CHLORIDE 30-0.9 UT/500ML-% IV SOLN
INTRAVENOUS | Status: AC
  Filled 2022-11-14: qty 500

## 2022-11-14 MED ORDER — ACETAMINOPHEN 10 MG/ML IV SOLN
INTRAVENOUS | Status: AC
  Filled 2022-11-14: qty 100

## 2022-11-14 MED ORDER — DIBUCAINE (PERIANAL) 1 % EX OINT: 1.0000 | TOPICAL_OINTMENT | CUTANEOUS | Status: DC | PRN

## 2022-11-14 MED ORDER — ACETAMINOPHEN 325 MG PO TABS
650.0000 mg | ORAL_TABLET | ORAL | Status: DC | PRN
  Administered 2022-11-15 (×2): 650 mg via ORAL
  Filled 2022-11-14 (×2): qty 2

## 2022-11-14 MED ORDER — PHENYLEPHRINE HCL-NACL 20-0.9 MG/250ML-% IV SOLN
INTRAVENOUS | Status: DC | PRN
  Administered 2022-11-14: 60 ug/min via INTRAVENOUS

## 2022-11-14 MED ORDER — DIPHENHYDRAMINE HCL 50 MG/ML IJ SOLN: 12.5000 mg | INTRAMUSCULAR | Status: DC | PRN

## 2022-11-14 MED ORDER — OXYTOCIN-SODIUM CHLORIDE 30-0.9 UT/500ML-% IV SOLN: 2.5000 [IU]/h | INTRAVENOUS | Status: AC

## 2022-11-14 MED ORDER — LACTATED RINGERS IV SOLN: INTRAVENOUS | Status: DC

## 2022-11-14 MED ORDER — ONDANSETRON HCL 4 MG/2ML IJ SOLN
INTRAMUSCULAR | Status: DC | PRN
  Administered 2022-11-14: 4 mg via INTRAVENOUS

## 2022-11-14 MED ORDER — MORPHINE SULFATE (PF) 0.5 MG/ML IJ SOLN
INTRAMUSCULAR | Status: DC | PRN
  Administered 2022-11-14: 150 ug via INTRATHECAL

## 2022-11-14 MED ORDER — NALOXONE HCL 0.4 MG/ML IJ SOLN: 0.4000 mg | INTRAMUSCULAR | Status: DC | PRN

## 2022-11-14 SURGICAL SUPPLY — 34 items
APL PRP STRL LF DISP 70% ISPRP (MISCELLANEOUS) ×2
APL SKNCLS STERI-STRIP NONHPOA (GAUZE/BANDAGES/DRESSINGS) ×2
BARRIER ADHS 3X4 INTERCEED (GAUZE/BANDAGES/DRESSINGS) IMPLANT
BENZOIN TINCTURE PRP APPL 2/3 (GAUZE/BANDAGES/DRESSINGS) IMPLANT
BRR ADH 4X3 ABS CNTRL BYND (GAUZE/BANDAGES/DRESSINGS)
CHLORAPREP W/TINT 26 (MISCELLANEOUS) ×4 IMPLANT
CLAMP UMBILICAL CORD (MISCELLANEOUS) ×2 IMPLANT
CLOTH BEACON ORANGE TIMEOUT ST (SAFETY) ×2 IMPLANT
DRSG OPSITE POSTOP 4X10 (GAUZE/BANDAGES/DRESSINGS) ×2 IMPLANT
ELECT REM PT RETURN 9FT ADLT (ELECTROSURGICAL) ×1
ELECTRODE REM PT RTRN 9FT ADLT (ELECTROSURGICAL) ×2 IMPLANT
EXTRACTOR VACUUM M CUP 4 TUBE (SUCTIONS) IMPLANT
GAUZE SPONGE 4X4 12PLY STRL LF (GAUZE/BANDAGES/DRESSINGS) IMPLANT
GLOVE BIO SURGEON STRL SZ 6.5 (GLOVE) ×2 IMPLANT
GLOVE BIOGEL PI IND STRL 7.0 (GLOVE) ×2 IMPLANT
GOWN STRL REUS W/TWL LRG LVL3 (GOWN DISPOSABLE) ×4 IMPLANT
KIT ABG SYR 3ML LUER SLIP (SYRINGE) IMPLANT
NDL HYPO 25X5/8 SAFETYGLIDE (NEEDLE) ×2 IMPLANT
NEEDLE HYPO 22GX1.5 SAFETY (NEEDLE) IMPLANT
NEEDLE HYPO 25X5/8 SAFETYGLIDE (NEEDLE) ×1
NS IRRIG 1000ML POUR BTL (IV SOLUTION) ×2 IMPLANT
PACK C SECTION WH (CUSTOM PROCEDURE TRAY) ×2 IMPLANT
PAD OB MATERNITY 4.3X12.25 (PERSONAL CARE ITEMS) ×2 IMPLANT
SUT CHROMIC 0 CTX 36 (SUTURE) ×4 IMPLANT
SUT PLAIN 0 NONE (SUTURE) IMPLANT
SUT PLAIN 2 0 XLH (SUTURE) IMPLANT
SUT VIC AB 0 CT1 27 (SUTURE) ×3
SUT VIC AB 0 CT1 27XBRD ANBCTR (SUTURE) ×6 IMPLANT
SUT VIC AB 4-0 KS 27 (SUTURE) IMPLANT
SYR CONTROL 10ML LL (SYRINGE) IMPLANT
TOWEL OR 17X24 6PK STRL BLUE (TOWEL DISPOSABLE) ×2 IMPLANT
TRAY FOLEY W/BAG SLVR 14FR LF (SET/KITS/TRAYS/PACK) ×2 IMPLANT
VACUUM CUP M-STYLE MYSTIC II (SUCTIONS) IMPLANT
WATER STERILE IRR 1000ML POUR (IV SOLUTION) ×2 IMPLANT

## 2022-11-14 NOTE — Progress Notes (Signed)
Informed pt that it was time to complete her orthostatics and walk to the bathroom. Pt wanted to wait until her family members and son left for the day to complete them.

## 2022-11-14 NOTE — Lactation Note (Signed)
This note was copied from a baby's chart. Lactation Consultation Note  Patient Name: Brittany Collins [id number] Date: 11/14/2022 Age:33 hours  Reason for consult: Initial assessment;Early term 37-38.6wks;Infant < 6lbs  P2, [redacted]w[redacted]d, Rpt C/S, IUGR, 5 lbs 4 oz  Initial visit to see P2 mother of early term infant. Mother voices concerns that baby is sleepy and not latching to the breast. She says baby will only nuzzle some but she is showing feeding cues occasionally. Discussed this is typical behavior for an early term infant. Mother has supplemented baby with donor breast milk once. Mother is pumping with her breast pump brought from home, which she prefers. She collected 10 ml. Informed that staff can set up the DEBP in the room, if she desires.   Discussed Early term, low birth weight infant feeding behavior.   Discussed a triple feeding approach: breastfeed with feeding cues and when baby is actively sucking/ feeding at breast. Stop when baby shows fatigue.  Lots of skin to skin and keep baby warm to cnserve calories. Supplement with mother's expressed milk and/or DBM every 2-3 hours. Pump for 15 min every 3 hours to stimulate milk production. Informed EBM is good at room temperature for 4 hours.   Mom made aware of O/P services, breastfeeding support groups,  and our phone # for post-discharge questions.     Maternal Data Has patient been taught Hand Expression?: No Does the patient have breastfeeding experience prior to this delivery?: Yes How long did the patient breastfeed?: other child not latch, she pumped for 12 months  Feeding Mother's Current Feeding Choice: Breast Milk   Interventions Interventions: Education  Discharge Pump: DEBP;Hands Free;Manual (Mom Cozy)  Consult Status Consult Status: Follow-up Date: 11/15/22 Follow-up type: In-patient    Omar Person, RN, IBCLC 11/14/2022, 6:34 PM

## 2022-11-14 NOTE — Anesthesia Procedure Notes (Signed)
Spinal  Patient location during procedure: OR Start time: 11/14/2022 7:30 AM End time: 11/14/2022 7:37 AM Staffing Performed: other anesthesia staff  Anesthesiologist: Atilano Median, DO Performed by: Graciela Husbands, CRNA Authorized by: Atilano Median, DO   Preanesthetic Checklist Completed: patient identified, IV checked, site marked, risks and benefits discussed, surgical consent, monitors and equipment checked, pre-op evaluation and timeout performed Spinal Block Patient position: sitting Prep: ChloraPrep Patient monitoring: heart rate, continuous pulse ox and blood pressure Approach: midline Location: L3-4 Injection technique: single-shot Needle Needle type: Pencan  Needle gauge: 24 G Needle length: 10 cm Assessment Sensory level: T4 Additional Notes Performed by A. Willia Craze

## 2022-11-14 NOTE — Brief Op Note (Signed)
11/14/2022  8:25 AM  PATIENT:  Brittany Collins  32 y.o. female  PRE-OPERATIVE DIAGNOSIS:  IUP at 37 weeks Previous Cesarean Section IUGR  POST-OPERATIVE DIAGNOSIS:   Same  PROCEDURE:  Procedure(s): REPEAT SECTION  REPEAT X1 (N/A)  SURGEON:  Surgeons and Role:    Marcelle Overlie, MD - Primary  PHYSICIAN ASSISTANT:   ASSISTANTS: none   ANESTHESIA:   spinal  EBL:  per anesthesia record    BLOOD ADMINISTERED:none  DRAINS: Urinary Catheter (Foley)   LOCAL MEDICATIONS USED:  NONE  SPECIMEN:  No Specimen  DISPOSITION OF SPECIMEN:  PATHOLOGY  COUNTS:  YES  TOURNIQUET:  * No tourniquets in log *  DICTATION: .Other Dictation: Dictation Number dictated  PLAN OF CARE: Admit to inpatient   PATIENT DISPOSITION:  PACU - hemodynamically stable.   Delay start of Pharmacological VTE agent (>24hrs) due to surgical blood loss or risk of bleeding: not applicable

## 2022-11-14 NOTE — H&P (Signed)
Brittany Collins is a 32 y.o. G 2 P 1 at 37 weeks presents for Repeat LTCS for IUGR. EFW 7% last week in the office.  OB History     Gravida  2   Para  1   Term  1   Preterm      AB      Living  1      SAB      IAB      Ectopic      Multiple  0   Live Births  1          Past Medical History:  Diagnosis Date  . ADHD   . Allergic rhinitis   . Anxiety   . Chicken pox   . Chronic pansinusitis   . GERD (gastroesophageal reflux disease)   . Headache   . History of kidney stones   . Migraines    Past Surgical History:  Procedure Laterality Date  . CESAREAN SECTION N/A 09/02/2020   Procedure: PRIMARY CESAREAN SECTION EDC: 09-05-20 ALLERG: SULFA, TRAMADOL;  Surgeon: Marcelle Overlie, MD;  Location: [facility] LD ORS;  Service: Obstetrics;  Laterality: N/A;  . WISDOM TOOTH EXTRACTION     Family History: family history includes Arthritis in her mother; Diabetes in her maternal grandfather and maternal grandmother; Heart disease in her father, maternal grandfather, and maternal grandmother; Hypertension in her mother; Sleep apnea in her maternal grandmother and mother; Stroke in her paternal grandfather. Social History:  reports that she has quit smoking. Her smoking use included cigarettes. She has a 7 pack-year smoking history. She has never used smokeless tobacco. She reports current alcohol use. She reports that she does not use drugs.     Maternal Diabetes: No Genetic Screening: Normal Maternal Ultrasounds/Referrals: Normal Fetal Ultrasounds or other Referrals:  None Maternal Substance Abuse:  No Significant Maternal Medications:  None Significant Maternal Lab Results:  None Number of Prenatal Visits:greater than 3 verified prenatal visits Maternal Vaccinations:Flu Other Comments:  None  Review of Systems  All other systems reviewed and are negative.  Maternal Medical History:  Prenatal complications: IUGR.      Blood pressure 127/83, pulse 87, temperature  97.8 F (36.6 C), temperature source Oral, resp. rate 19, height 5\' 4"  (1.626 m), weight 80.2 kg, SpO2 98%, unknown if currently breastfeeding. Exam Physical Exam Vitals and nursing note reviewed. Exam conducted with a chaperone present.  Constitutional:      Appearance: Normal appearance.  HENT:     Head: Normocephalic.  Cardiovascular:     Rate and Rhythm: Normal rate and regular rhythm.     Pulses: Normal pulses.  Pulmonary:     Effort: Pulmonary effort is normal.  Neurological:     Mental Status: She is alert.    Prenatal labs: ABO, Rh: --/--/A POS (10/15 1051) Antibody: NEG (10/15 1051) Rubella: Immune (04/03 0000) RPR: NON REACTIVE (10/15 1048)  HBsAg: Negative (04/03 0000)  HIV: Non-reactive (04/03 0000)  GBS:     Assessment/Plan: IUP at 37 weeks IUGR  Previous C Section Repeat LTCS Risks reviewed  Consent signed   Jeani Hawking 11/14/2022, 7:16 AM

## 2022-11-14 NOTE — Anesthesia Postprocedure Evaluation (Signed)
Anesthesia Post Note  Patient: Brittany Collins  Procedure(s) Performed: REPEAT SECTION  REPEAT X1     Patient location during evaluation: Mother Baby Anesthesia Type: Spinal Level of consciousness: oriented and awake and alert Pain management: pain level controlled Vital Signs Assessment: post-procedure vital signs reviewed and stable Respiratory status: spontaneous breathing and respiratory function stable Cardiovascular status: blood pressure returned to baseline and stable Postop Assessment: no headache, no backache, no apparent nausea or vomiting and able to ambulate Anesthetic complications: no   No notable events documented.  Last Vitals:  Vitals:   11/14/22 1155 11/14/22 1242  BP: 108/74 110/70  Pulse: 64 (!) 58  Resp: 17 17  Temp: 36.9 C 37.1 C  SpO2: 98% 98%    Last Pain:  Vitals:   11/14/22 1242  TempSrc: Oral  PainSc:                  Nelle Don Jing Howatt

## 2022-11-14 NOTE — Op Note (Signed)
Brittany Collins, Brittany Collins MEDICAL RECORD NO: [id number] ACCOUNT NO: [id number] DATE OF BIRTH: November 29, 1990 FACILITY: [facility] LOCATION: [facility] PHYSICIAN: Becky Berberian L. Vincente Poli, MD  Operative Report   DATE OF PROCEDURE: 11/14/2022  PREOPERATIVE DIAGNOSES:  Intrauterine pregnancy at 37 weeks, previous cesarean section and intrauterine growth restriction.  POSTOPERATIVE DIAGNOSES:  Intrauterine pregnancy at 37 weeks, previous cesarean section and intrauterine growth restriction.  PROCEDURE:  Repeat low transverse cesarean section.  SURGEON:  Yaa Donnellan L. Oleva Koo, MD.  ANESTHESIA:  Spinal.  ESTIMATED BLOOD LOSS:  Less than 500 mL.  COMPLICATIONS:  None.  DRAINS:  Foley.    PATHOLOGY:  None.  DESCRIPTION OF PROCEDURE:  The patient was taken to the operating room where spinal was placed.  She was then prepped and draped and a Foley catheter was inserted.  A low transverse incision was made and carried down to the fascia.  The fascia was scored  in the midline and extended laterally.  The rectus muscles were separated in the midline and the peritoneum was then opened and the peritoneal incision was then stretched.  The bladder flap was developed after bladder blade was inserted.  A low  transverse incision was made in the uterus.  The uterus was entered using hemostat.  Amniotic fluid was clear.  The baby was in cephalic presentation, was delivered easily with IUGR and was a female infant with Apgars 8 at 1 minute and 9 at 5 minutes.   The cord was clamped and cut.  The placenta was manually removed, noted to be normal, intact with a 3-vessel cord.  The uterus was exteriorized and cleared of all clots and debris.  The uterine incision was closed in 1 layer using 0 chromic in a running  locked stitch.  Irrigation was performed.  Hemostasis was noted.  The peritoneum was closed using 0 Vicryl in a running stitch.  The fascia was closed using 0 Vicryl in a running stitch.  The subcutaneous was closed with  3-0 Vicryl on a Keith needle.   Benzoin, Steri-Strips and a honeycomb dressing were applied.  All sponge, lap and instrument counts were correct x2.  The patient went to recovery room in stable condition.   CHR D: 11/14/2022 8:31:48 am T: 11/14/2022 8:43:00 am  JOB: [id number]/ [id number]

## 2022-11-14 NOTE — Transfer of Care (Signed)
Immediate Anesthesia Transfer of Care Note  Patient: Brittany Collins  Procedure(s) Performed: REPEAT SECTION  REPEAT X1  Patient Location: PACU  Anesthesia Type:Spinal  Level of Consciousness: awake, alert , and oriented  Airway & Oxygen Therapy: Patient Spontanous Breathing  Post-op Assessment: Report given to RN and Post -op Vital signs reviewed and stable  Post vital signs: Reviewed and stable  Last Vitals:  Vitals Value Taken Time  BP    Temp    Pulse    Resp    SpO2      Last Pain:  Vitals:   11/14/22 0559  TempSrc: Oral         Complications: No notable events documented.

## 2022-11-15 LAB — CBC
HCT: 29.6 % — ABNORMAL LOW (ref 36.0–46.0)
Hemoglobin: 9.9 g/dL — ABNORMAL LOW (ref 12.0–15.0)
MCH: 27.8 pg (ref 26.0–34.0)
MCHC: 33.4 g/dL (ref 30.0–36.0)
MCV: 83.1 fL (ref 80.0–100.0)
Platelets: 241 10*3/uL (ref 150–400)
RBC: 3.56 MIL/uL — ABNORMAL LOW (ref 3.87–5.11)
RDW: 17.6 % — ABNORMAL HIGH (ref 11.5–15.5)
WBC: 16.9 10*3/uL — ABNORMAL HIGH (ref 4.0–10.5)
nRBC: 0 % (ref 0.0–0.2)

## 2022-11-15 NOTE — Lactation Note (Addendum)
This note was copied from a baby's chart. Lactation Consultation Note  Patient Name: Brittany Collins ZOXWR'U Date: 11/15/2022 Age:33 hours  Reason for consult: Follow-up assessment;Early term 37-38.6wks;Infant < 6lbs  P2, [redacted]w[redacted]d, 6% weight loss  Mother is resting in bed with baby skin to skin. She reports baby has latched a couple of times but she does know if she got anything. Mother encouraged to latch  with cues and watch baby for active feeding. Limit breastfeeding to 10 minutes and continue to supplement due to infant's weight and stamina.   Baby is getting donor breast milk. Mother is feeling discouraged because she is not getting as much colostrum with pumping as she did yesterday. Mother is using her own hands free pump and manual pump. Offered to set up the hospital grade DEBP. Mother prefers the other pumps at this time. Encouraged to pump every 3 hours for 15 minutes.   Discussed the process of milk production, "supply and demand" and the importance of breast stimulation and milk removal in order to make an optimal milk supply.   Discussed with mother to attempt to breastfeed when baby shows feeding cues limiting feeding to 10 minutes. Continue skin to skin.    Continue to hand express and/or pump to remove milk from the breast for baby  Mother verbalized understanding. Has breast pump at home and ability to hand to hand express milk form her breast.       Feeding Mother's Current Feeding Choice: Breast Milk and Donor Milk    Lactation Tools Discussed/Used Tools: Pump Breast pump type: Other (comment) (Mother is using her own breast pump and the hand pump) Pump Education: Milk Storage Reason for Pumping: < 6 lbs, ETI Pumping frequency: every 3 hours for 15 minutes Pumped volume: 5 mL  Interventions Interventions: Education  Discharge Pump: Manual;Hands Free  Consult Status Consult Status: Follow-up Date: 11/16/22 Follow-up type: In-patient    [name] M 11/15/2022, 12:21 PM

## 2022-11-15 NOTE — Lactation Note (Signed)
This note was copied from a baby's chart. Lactation Consultation Note  Patient Name: Girl Shontaye Oyama BMWUX'[id number] Date: 11/15/2022 Age:33 hours  Reason for consult: Follow-up assessment;Early term 37-38.6wks;Infant < 6lbs  Mother agreeable to pump with hospital DEBP. Instructed mother on the use, frequency, cleaning of breast pump and storage of breast milk.    Feeding Mother's Current Feeding Choice: Breast Milk and Donor Milk   Lactation Tools Discussed/Used Tools: Pump;Flanges Flange Size: 27;24 Breast pump type: Double-Electric Breast Pump Pump Education: Milk Storage Reason for Pumping: < 6 lbs, ETI Pumping frequency: every 3 hours Pumped volume: 5 mL  Interventions Interventions: Education  Discharge Pump: Manual;Hands Free  Consult Status Consult Status: Follow-up Date: 11/16/22 Follow-up type: In-patient   Omar Person, RN, IBCLC 11/15/2022, 3:28 PM

## 2022-11-15 NOTE — Progress Notes (Signed)
Subjective: Postpartum Day 1: Cesarean Delivery Patient reports tolerating PO and no problems voiding.    Objective: Vital signs in last 24 hours: Temp:  [98 F (36.7 C)-98.7 F (37.1 C)] 98 F (36.7 C) (10/18 0526) Pulse Rate:  [50-64] 50 (10/18 0526) Resp:  [16-17] 16 (10/18 0526) BP: (97-121)/(60-74) 113/65 (10/18 0526) SpO2:  [96 %-100 %] 99 % (10/18 0526)  Physical Exam:  General: alert, cooperative, appears stated age, and no distress Lochia: appropriate Uterine Fundus: firm Incision: healing well DVT Evaluation: No evidence of DVT seen on physical exam.  Recent Labs    11/12/22 1048 11/15/22 0509  HGB 11.3* 9.9*  HCT 34.6* 29.6*    Assessment/Plan: Status post Cesarean section. Doing well postoperatively.  Continue current care.  Turner Daniels, MD 11/15/2022, 9:11 AM

## 2022-11-16 MED ORDER — IBUPROFEN 600 MG PO TABS: 600.0000 mg | ORAL_TABLET | Freq: Four times a day (QID) | ORAL | 0 refills | Status: DC | PRN

## 2022-11-16 NOTE — Discharge Summary (Signed)
Postpartum Discharge Summary       Patient Name: Brittany Collins DOB: [birth year] MRN: [id number]  Date of admission: 11/14/2022 Delivery date:11/14/2022 Delivering provider: Marcelle Overlie Date of discharge: 11/16/2022  Admitting diagnosis: IUGR (intrauterine growth restriction) affecting care of mother [O36.5990] S/P cesarean section [Z98.891] Intrauterine pregnancy: [redacted]w[redacted]d     Secondary diagnosis:  Principal Problem:   IUGR (intrauterine growth restriction) affecting care of mother Active Problems:   S/P cesarean section  Additional problems:      Discharge diagnosis: Term Pregnancy Delivered                                              Post partum procedures:   Augmentation: N/A Complications: None  Hospital course: Sceduled C/S   32 y.o. yo G2P2002 at [redacted]w[redacted]d was admitted to the hospital 11/14/2022 for scheduled cesarean section with the following indication:Elective Repeat and IUGR .Delivery details are as follows:  Membrane Rupture Time/Date: 8:01 AM,11/14/2022  Delivery Method:C-Section, Low Transverse Operative Delivery:N/A Details of operation can be found in separate operative note.  Patient had a postpartum course uncomplicated She is ambulating, tolerating a regular diet, passing flatus, and urinating well. Patient is discharged home in stable condition on  11/16/22        Newborn Data: Birth date:11/14/2022 Birth time:8:02 AM Gender:Female Living status:Living Apgars:9 ,9  Weight:2390 g    Magnesium Sulfate received: No BMZ received: No Rhophylac:N/A MMR:N/A T-DaP:Given prenatally Flu: N/A RSV Vaccine received: No Transfusion:No Immunizations administered: Immunization History  Administered Date(s) Administered   Tdap 07/25/2018    Physical exam  Vitals:   11/15/22 1250 11/15/22 2133 11/16/22 0551 11/16/22 1504  BP: 103/72 118/75 105/75 131/71  Pulse: 71 76 67 84  Resp: 18 17 16 20   Temp: 98.3 F (36.8 C) 97.9 F (36.6 C) 97.7 F  (36.5 C) 98.4 F (36.9 C)  TempSrc: Oral Oral Oral Oral  SpO2: 95% 95% 97%   Weight:      Height:       General: alert, cooperative, and no distress Lochia: appropriate Uterine Fundus: firm Incision: Healing well with no significant drainage DVT Evaluation: No evidence of DVT seen on physical exam. Labs: Lab Results  Component Value Date   WBC 16.9 (H) 11/15/2022   HGB 9.9 (L) 11/15/2022   HCT 29.6 (L) 11/15/2022   MCV 83.1 11/15/2022   PLT 241 11/15/2022      Latest Ref Rng & Units 05/25/2019   10:40 AM  CMP  Glucose 65 - 99 mg/dL 84   BUN 6 - 20 mg/dL 12   Creatinine 5.78 - 1.00 mg/dL 4.69   Sodium 629 - 528 mmol/L 140   Potassium 3.5 - 5.2 mmol/L 4.1   Chloride 96 - 106 mmol/L 105   CO2 20 - 29 mmol/L 23   Calcium 8.7 - 10.2 mg/dL 9.2   Total Protein 6.0 - 8.5 g/dL 7.0   Total Bilirubin 0.0 - 1.2 mg/dL 0.2   Alkaline Phos 39 - 117 IU/L 65   AST 0 - 40 IU/L 18   ALT 0 - 32 IU/L 15    Edinburgh Score:    11/16/2022   11:23 AM  Edinburgh Postnatal Depression Scale Screening Tool  I have been able to laugh and see the funny side of things. 0  I have looked forward with enjoyment to things.  0  I have blamed myself unnecessarily when things went wrong. 1  I have been anxious or worried for no good reason. 2  I have felt scared or panicky for no good reason. 0  Things have been getting on top of me. 0  I have been so unhappy that I have had difficulty sleeping. 0  I have felt sad or miserable. 0  I have been so unhappy that I have been crying. 0  The thought of harming myself has occurred to me. 0  Edinburgh Postnatal Depression Scale Total 3      After visit meds:  Allergies as of 11/16/2022       Reactions   Tramadol Other (See Comments)   headache migraines   Sulfa Antibiotics Other (See Comments)   Reaction unknown   Sulfamethoxazole    Other reaction(s): Confusion (intolerance)        Medication List     TAKE these medications     esomeprazole 20 MG capsule Commonly known as: NEXIUM Take 20 mg by mouth daily at 12 noon.   ferrous sulfate 324 MG Tbec Take 324 mg by mouth daily with breakfast.   FLUoxetine 10 MG capsule Commonly known as: PROZAC Take 10 mg by mouth daily.   ibuprofen 600 MG tablet Commonly known as: ADVIL Take 1 tablet (600 mg total) by mouth every 6 (six) hours as needed for mild pain (pain score 1-3) or moderate pain (pain score 4-6).   prenatal multivitamin Tabs tablet Take 1 tablet by mouth daily at 12 noon.         Discharge home in stable condition Infant Feeding: Breast Infant Disposition:home with mother Discharge instruction: per After Visit Summary and Postpartum booklet. Activity: Advance as tolerated. Pelvic rest for 6 weeks.  Diet: routine diet Anticipated Birth Control: Unsure Postpartum Appointment:6 weeks Additional Postpartum F/U:    Future Appointments:No future appointments. Follow up Visit:      11/16/2022 Turner Daniels, MD

## 2022-11-16 NOTE — Progress Notes (Signed)
Subjective: Postpartum Day 2: Cesarean Delivery Patient reports tolerating PO, + flatus, + BM, and no problems voiding.    Objective: Vital signs in last 24 hours: Temp:  [97.7 F (36.5 C)-98.3 F (36.8 C)] 97.7 F (36.5 C) (10/19 0551) Pulse Rate:  [67-76] 67 (10/19 0551) Resp:  [16-18] 16 (10/19 0551) BP: (103-118)/(72-75) 105/75 (10/19 0551) SpO2:  [95 %-97 %] 97 % (10/19 0551)  Physical Exam:  General: alert, cooperative, appears stated age, and no distress Lochia: appropriate Uterine Fundus: firm Incision: healing well DVT Evaluation: No evidence of DVT seen on physical exam.  Recent Labs    11/15/22 0509  HGB 9.9*  HCT 29.6*    Assessment/Plan: Status post Cesarean section. Doing well postoperatively. Baby is IUGR and may not be able to go home today but Fr[place]on would be able to be discharged whenever daughter is cleared   Continue current care.  Turner Daniels, MD 11/16/2022, 9:06 AM

## 2022-11-16 NOTE — Lactation Note (Signed)
This note was copied from a baby's chart. Lactation Consultation Note  Patient Name: Brittany Collins ZOXWR'U Date: 11/16/2022 Age:33 hours   P2- LC to room to consult, but MOB was asleep. LC will attempt to see her again later today.  Dema Severin BS, IBCLC 11/16/2022, 11:54 AM

## 2022-11-16 NOTE — Lactation Note (Signed)
This note was copied from a baby's chart. Lactation Consultation Note  Patient Name: Brittany Collins UXLKG'M Date: 11/16/2022 Age:33 hours Reason for consult: Follow-up assessment;Early term 37-38.6wks;Infant < 6lbs;Infant weight loss  P2- Infant has a weight loss of 8.79%. MOB states that infant is trying her best on the breast, but not quite getting it yet. MOB also reports that her milk is starting to transition in. LC reviewed infant's weight loss/low birth weight and recommended creating an LPI/low birth weight feeding plan. MOB agreed.   New feeding plan as followed: -Keep feedings 30 minutes or less. -Supplement with MOB's breast milk, DBM or formula after every breast feeding. -Breastfeed no longer than 15 minutes. -Bottle feed no longer than 15 minutes. -Day 1 supplementation is minimum 10-12 mL, day 2 is 15-18 mL, and day 3 is 20-24 mL.  LC reviewed feeding infant on cue 8-12x in 24 hrs, not allowing infant to go over 3 hrs without a feeding, CDC milk storage guidelines and LC services handout. LC encouraged MOB to call lactation team for further assistance as needed.  Maternal Data Does the patient have breastfeeding experience prior to this delivery?: Yes How long did the patient breastfeed?: 1 year exclusive pumping with first child  Feeding Mother's Current Feeding Choice: Breast Milk and Donor Milk (and formula) Nipple Type: Slow - flow  Lactation Tools Discussed/Used Tools: Pump;Flanges Flange Size: 27 (per MOB (LC did not size and MOB stated that this is the size she knows she needs)) Breast pump type: Double-Electric Breast Pump;Manual Pump Education: Setup, frequency, and cleaning;Milk Storage  Interventions Interventions: Breast feeding basics reviewed;Position options;Education;Pace feeding;LPT handout/interventions;LC Services brochure  Discharge Discharge Education: Engorgement and breast care;Warning signs for feeding baby Pump: Hands  Free;Personal  Consult Status Consult Status: Follow-up Date: 11/17/22 Follow-up type: In-patient    Dema Severin BS, IBCLC 11/16/2022, 1:57 PM

## 2022-11-21 ENCOUNTER — Other Ambulatory Visit: Payer: Self-pay

## 2022-11-21 ENCOUNTER — Emergency Department (HOSPITAL_BASED_OUTPATIENT_CLINIC_OR_DEPARTMENT_OTHER): Payer: Commercial Managed Care - PPO

## 2022-11-21 ENCOUNTER — Encounter (HOSPITAL_BASED_OUTPATIENT_CLINIC_OR_DEPARTMENT_OTHER): Payer: Self-pay | Admitting: Emergency Medicine

## 2022-11-21 ENCOUNTER — Inpatient Hospital Stay (HOSPITAL_BASED_OUTPATIENT_CLINIC_OR_DEPARTMENT_OTHER)
Admission: EM | Admit: 2022-11-21 | Discharge: 2022-11-22 | DRG: 776 | Disposition: A | Discharge status: Home / Self Care | Payer: Commercial Managed Care - PPO | Attending: Obstetrics and Gynecology | Admitting: Obstetrics and Gynecology

## 2022-11-21 DIAGNOSIS — Z8261 Family history of arthritis: Secondary | ICD-10-CM | POA: Diagnosis not present

## 2022-11-21 DIAGNOSIS — Z87891 Personal history of nicotine dependence: Secondary | ICD-10-CM

## 2022-11-21 DIAGNOSIS — Z833 Family history of diabetes mellitus: Secondary | ICD-10-CM | POA: Diagnosis not present

## 2022-11-21 DIAGNOSIS — R001 Bradycardia, unspecified: Principal | ICD-10-CM | POA: Diagnosis present

## 2022-11-21 DIAGNOSIS — Z885 Allergy status to narcotic agent status: Secondary | ICD-10-CM

## 2022-11-21 DIAGNOSIS — Z882 Allergy status to sulfonamides status: Secondary | ICD-10-CM | POA: Diagnosis not present

## 2022-11-21 DIAGNOSIS — O99893 Other specified diseases and conditions complicating puerperium: Secondary | ICD-10-CM | POA: Diagnosis present

## 2022-11-21 DIAGNOSIS — Z8249 Family history of ischemic heart disease and other diseases of the circulatory system: Secondary | ICD-10-CM | POA: Diagnosis not present

## 2022-11-21 DIAGNOSIS — Z823 Family history of stroke: Secondary | ICD-10-CM | POA: Diagnosis not present

## 2022-11-21 DIAGNOSIS — O1495 Unspecified pre-eclampsia, complicating the puerperium: Secondary | ICD-10-CM | POA: Diagnosis present

## 2022-11-21 LAB — COMPREHENSIVE METABOLIC PANEL
ALT: 20 U/L (ref 0–44)
AST: 20 U/L (ref 15–41)
Albumin: 3.3 g/dL — ABNORMAL LOW (ref 3.5–5.0)
Alkaline Phosphatase: 82 U/L (ref 38–126)
Anion gap: 11 (ref 5–15)
BUN: 14 mg/dL (ref 6–20)
CO2: 22 mmol/L (ref 22–32)
Calcium: 8.3 mg/dL — ABNORMAL LOW (ref 8.9–10.3)
Chloride: 107 mmol/L (ref 98–111)
Creatinine, Ser: 0.74 mg/dL (ref 0.44–1.00)
GFR, Estimated: >60 mL/min (ref >60)
Glucose, Bld: 80 mg/dL (ref 70–99)
Potassium: 3.9 mmol/L (ref 3.5–5.1)
Sodium: 140 mmol/L (ref 135–145)
Total Bilirubin: 0.6 mg/dL (ref 0.3–1.2)
Total Protein: 6.9 g/dL (ref 6.5–8.1)

## 2022-11-21 LAB — MAGNESIUM: Magnesium: 1.9 mg/dL (ref 1.7–2.4)

## 2022-11-21 LAB — URINALYSIS, W/ REFLEX TO CULTURE (INFECTION SUSPECTED)
Bilirubin Urine: NEGATIVE
Glucose, UA: NEGATIVE mg/dL
Ketones, ur: NEGATIVE mg/dL
Leukocytes,Ua: NEGATIVE
Nitrite: NEGATIVE
Protein, ur: NEGATIVE mg/dL
Specific Gravity, Urine: 1.02 (ref 1.005–1.030)
WBC, UA: NONE SEEN WBC/hpf (ref 0–5)
pH: 7 (ref 5.0–8.0)

## 2022-11-21 LAB — CBC WITH DIFFERENTIAL/PLATELET
Abs Immature Granulocytes: 0.09 10*3/uL — ABNORMAL HIGH (ref 0.00–0.07)
Basophils Absolute: 0 10*3/uL (ref 0.0–0.1)
Basophils Relative: 0 %
Eosinophils Absolute: 0.1 10*3/uL (ref 0.0–0.5)
Eosinophils Relative: 1 %
HCT: 34.8 % — ABNORMAL LOW (ref 36.0–46.0)
Hemoglobin: 11.6 g/dL — ABNORMAL LOW (ref 12.0–15.0)
Immature Granulocytes: 1 %
Lymphocytes Relative: 19 %
Lymphs Abs: 1.9 10*3/uL (ref 0.7–4.0)
MCH: 28.2 pg (ref 26.0–34.0)
MCHC: 33.3 g/dL (ref 30.0–36.0)
MCV: 84.5 fL (ref 80.0–100.0)
Monocytes Absolute: 0.4 10*3/uL (ref 0.1–1.0)
Monocytes Relative: 4 %
Neutro Abs: 7.2 10*3/uL (ref 1.7–7.7)
Neutrophils Relative %: 75 %
Platelets: 296 10*3/uL (ref 150–400)
RBC: 4.12 MIL/uL (ref 3.87–5.11)
RDW: 17 % — ABNORMAL HIGH (ref 11.5–15.5)
WBC: 9.7 10*3/uL (ref 4.0–10.5)
nRBC: 0 % (ref 0.0–0.2)

## 2022-11-21 LAB — TROPONIN I (HIGH SENSITIVITY)
Troponin I (High Sensitivity): 4 ng/L (ref <18)
Troponin I (High Sensitivity): 11 ng/L (ref <18)

## 2022-11-21 MED ORDER — IOHEXOL 350 MG/ML SOLN
100.0000 mL | Freq: Once | INTRAVENOUS | Status: AC | PRN
  Administered 2022-11-21: 75 mL via INTRAVENOUS

## 2022-11-21 MED ORDER — PROCHLORPERAZINE EDISYLATE 10 MG/2ML IJ SOLN
5.0000 mg | Freq: Once | INTRAMUSCULAR | Status: AC
  Administered 2022-11-21: 5 mg via INTRAVENOUS
  Filled 2022-11-21: qty 2

## 2022-11-21 MED ORDER — MAGNESIUM SULFATE 40 GM/1000ML IV SOLN: 2.0000 g/h | INTRAVENOUS | Status: AC

## 2022-11-21 MED ORDER — LACTATED RINGERS IV SOLN: INTRAVENOUS | Status: DC

## 2022-11-21 MED ORDER — MAGNESIUM SULFATE BOLUS VIA INFUSION
4.0000 g | Freq: Once | INTRAVENOUS | Status: AC
  Administered 2022-11-21: 4 g via INTRAVENOUS
  Filled 2022-11-21: qty 1000

## 2022-11-21 MED ORDER — HYDRALAZINE HCL 20 MG/ML IJ SOLN: 10.0000 mg | INTRAMUSCULAR | Status: DC | PRN

## 2022-11-21 MED ORDER — LABETALOL HCL 5 MG/ML IV SOLN: 20.0000 mg | INTRAVENOUS | Status: DC | PRN

## 2022-11-21 MED ORDER — DIPHENHYDRAMINE HCL 50 MG/ML IJ SOLN
12.5000 mg | Freq: Once | INTRAMUSCULAR | Status: AC
  Administered 2022-11-21: 12.5 mg via INTRAVENOUS
  Filled 2022-11-21: qty 1

## 2022-11-21 MED ORDER — ACETAMINOPHEN 500 MG PO TABS
1000.0000 mg | ORAL_TABLET | Freq: Three times a day (TID) | ORAL | Status: DC
  Administered 2022-11-22 (×2): 1000 mg via ORAL
  Filled 2022-11-21 (×4): qty 2

## 2022-11-21 MED ORDER — HYDRALAZINE HCL 20 MG/ML IJ SOLN
5.0000 mg | INTRAMUSCULAR | Status: DC | PRN
  Administered 2022-11-21: 5 mg via INTRAVENOUS
  Filled 2022-11-21: qty 1

## 2022-11-21 MED ORDER — MAGNESIUM SULFATE 40 GM/1000ML IV SOLN
2.0000 g/h | INTRAVENOUS | Status: DC
  Administered 2022-11-21: 2 g/h via INTRAVENOUS
  Filled 2022-11-21: qty 1000

## 2022-11-21 MED ORDER — FLUOXETINE HCL 10 MG PO CAPS
10.0000 mg | ORAL_CAPSULE | Freq: Every day | ORAL | Status: DC
  Administered 2022-11-22: 10 mg via ORAL
  Filled 2022-11-21 (×2): qty 1

## 2022-11-21 MED ORDER — ACETAMINOPHEN 500 MG PO TABS
ORAL_TABLET | ORAL | Status: AC
  Administered 2022-11-21: 1000 mg via ORAL
  Filled 2022-11-21: qty 2

## 2022-11-21 MED ORDER — LABETALOL HCL 5 MG/ML IV SOLN: 40.0000 mg | INTRAVENOUS | Status: DC | PRN

## 2022-11-21 MED ORDER — HYDRALAZINE HCL 20 MG/ML IJ SOLN: 5.0000 mg | INTRAMUSCULAR | Status: DC | PRN

## 2022-11-21 MED ORDER — IBUPROFEN 800 MG PO TABS
800.0000 mg | ORAL_TABLET | Freq: Once | ORAL | Status: AC
  Administered 2022-11-21: 800 mg via ORAL
  Filled 2022-11-21: qty 1

## 2022-11-21 NOTE — ED Notes (Signed)
Pt went to the bathroom before we could obtain a urine sample. Will obtain another after next attempt.

## 2022-11-21 NOTE — ED Notes (Signed)
Pt endorsed a new headache onset.

## 2022-11-21 NOTE — ED Notes (Signed)
Procured 40g/1L infusion for transport.

## 2022-11-21 NOTE — Consult Note (Addendum)
MFM Plan of Care     Patient information  Patient Name: Brittany Collins  Patient MRN:   [id number]  Referring practice: [facility] Referring Provider: Harold Hedge  MFM CONSULT  Brittany Collins is a 32 y.o. [phone] admitted for bradycardia and concern for postpartum preeclampsia.  The patient had a repeat C-section on 11/14/2022 and was discharged 2 days later in good status.  On 11/21/2022 she was at an allergy appointment and her pulse was noted to be in the 30s.  At the time she was not taking any medications known to cause decreased heart rate.  She was directed toward the [facility] ER.  During her evaluation at the ER her blood pressure was elevated and she noted to have a headache.  Due to concern for postpartum preeclampsia and possibly peripartum cardiomyopathy she was transferred to the main campus at [facility].  Cardiology consult was completed with recommendations pending.  She had an EKG that was sinus bradycardia.  Her preeclampsia labs were normal.  She had a head CT that was negative for acute intracranial processes.  Since admission her headache is improved after analgesia.  She also reports a history of migraines but this feels different. She had some "black spots" but those have resolved as well. Her BP was been normotenisve on this last read. She denies SOB, orthopnea, DOE or extreme fatigue.   Assessment -Preeclampsia, postpartum -Sinus bradycardia Plan -Follow recommendations from cardiology -Follow-up on echo to assess for peripartum cardiomyopathy (low concern at this time) -Magnesium sulfate infusion for 12 to 24 hours as long as she continues to improve -Daily CMP/CBC -BNP -As needed antihypertensive medication for blood pressure goal of less than 150/100 -Start oral antihypertensive medicine if she is not at goal.  Recommend lisinopril, especially if there is concern for peripartum cardiomyopathy.  -If she continues to have headaches despite  analgesia recommend neurology consultation and brain MRI to rule out PRES syndrome. -Disposition: Continue inpatient care until results and plan is finalized.  Review of Systems: A review of systems was performed and was negative except per HPI   Vitals and Physical Exam    11/21/2022    3:15 PM 11/21/2022    2:59 PM 11/21/2022    2:30 PM  Vitals with BMI  Systolic 134 110 147  Diastolic 76 71 66  Pulse 47 52 46  Non-labored breathing Normal rate and rhythm Abdomen is nontender C-section incision site well-approximated without edema, palpable fluctuance/induration.  No overlying tenderness.   Past pregnancies OB History  Gravida Para Term Preterm AB Living  2 2 2     2   SAB IAB Ectopic Multiple Live Births        0 2    # Outcome Date GA Lbr Len/2nd Weight Sex Type Anes PTL Lv  2 Term 11/14/22 [redacted]w[redacted]d  2390 g F CS-LTranv Spinal  LIV  1 Term 09/02/20 [redacted]w[redacted]d  2720 g M CS-LTranv Spinal  LIV    I spent 60 minutes reviewing the patients chart, including labs and images as well as counseling the patient about her medical conditions. Greater than 50% of the time was spent in direct face-to-face patient counseling.  Braxton Feathers  MFM, [place]   11/21/2022  4:21 PM

## 2022-11-21 NOTE — ED Notes (Signed)
Patient transported to CT 

## 2022-11-21 NOTE — ED Notes (Signed)
Spoke with EDP about available Mag gtt's in the pyxis. Do not have 300cc/4g mag drip, but we can make a 4g/500cc drip and give it over 30 minutes. Best we can do, and EDP Robbins aware and okay with procedure.

## 2022-11-21 NOTE — ED Notes (Signed)
Called [facility] to arrange transport

## 2022-11-21 NOTE — MAU Provider Note (Signed)
Cardiology Consultation:  Patient ID: Brittany Collins MRN: [id number]; DOB: [birth year]  Admit date: 11/21/2022 Date of Consult: 11/21/2022  Primary Care Provider: System, Provider Not In Primary Cardiologist: None  Primary Electrophysiologist:  None   Patient Profile:  Brittany Collins is a 32 y.o. female with a hx of ADHD and recent pregnancy who is being seen today for the evaluation of sinus bradycardia at the request of Harold Hedge, MD.  History of Present Illness:  Ms. Carlyon presents with headache and bradycardia. She was recently discharged from the hospital on 11/16/2022.  She underwent a C-section and had successful delivery of a baby girl.  Baby is doing well.  Today she went to the allergist to do testing.  They noted that her heart rate was in the 30 to 40 bpm range.  She was instructed to go to the ER.  While in the ER she developed intense headache.  She reports she had no dizziness or lightheadedness.  No syncope.  She apparently has a 55-year-old child at home in addition to the newborn.  Reports no limitations doing activity.  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  There is no history of cardiovascular disease in this patient.  They were concerned for possible preeclampsia.  Cardiology asked to assess due to bradycardia.  Her father was in the room.  They report when she went to the bathroom her heart rate went into the 70s.  Labs notable for serum creatinine of 0.74.  Hemoglobin 11.6.  Troponin 4 and 11 on repeat.  TSH is pending.  CT head negative.  EKG shows sinus bradycardia heart rate 39 with narrow intervals.  CV exam unremarkable.  Home medications include fluoxetine.  This medication is not commonly associated with bradycardia.  Heart Pathway Score:       Past Medical History: Past Medical History:  Diagnosis Date   ADHD    Allergic rhinitis    Anxiety    Chicken pox    Chronic pansinusitis    GERD (gastroesophageal reflux disease)     Headache    History of kidney stones    Migraines     Past Surgical History: Past Surgical History:  Procedure Laterality Date   CESAREAN SECTION N/A 09/02/2020   Procedure: PRIMARY CESAREAN SECTION EDC: 09-05-20 ALLERG: SULFA, TRAMADOL;  Surgeon: Marcelle Overlie, MD;  Location: [facility];  Service: Obstetrics;  Laterality: N/A;   CESAREAN SECTION N/A 11/14/2022   Procedure: REPEAT SECTION  REPEAT X1;  Surgeon: Marcelle Overlie, MD;  Location: [facility] LD ORS;  Service: Obstetrics;  Laterality: N/A;   WISDOM TOOTH EXTRACTION       Home Medications:  Prior to Admission medications   Medication Sig Start Date End Date Taking? Authorizing Provider  FLUoxetine (PROZAC) 10 MG capsule Take 10 mg by mouth daily.    [provider]    Inpatient Medications: Scheduled Meds:  acetaminophen  1,000 mg Oral Q8H   Continuous Infusions:  lactated ringers     magnesium sulfate 2 g/hr (11/21/22 1324)   PRN Meds: hydrALAZINE  Allergies:    Allergies  Allergen Reactions   Tramadol Other (See Comments)    headache migraines    Sulfa Antibiotics Other (See Comments)    Reaction unknown   Sulfamethoxazole     Other reaction(s): Confusion (intolerance)    Social History:   Social History   Socioeconomic History   Marital status: Married    Spouse name: Not  on file   Number of children: 0   Years of education: 14   Highest education level: Not on file  Occupational History   Occupation: Groomer  Tobacco Use   Smoking status: Former    Current packs/day: 1.00    Average packs/day: 1 pack/day for 7.0 years (7.0 ttl pk-yrs)    Types: Cigarettes   Smokeless tobacco: Never  Vaping Use   Vaping status: Never Used  Substance and Sexual Activity   Alcohol use: Yes    Comment: <1 per month   Drug use: No   Sexual activity: Yes    Birth control/protection: None  Other Topics Concern   Not on file  Social History Narrative   Fun/Hobby: Play music   Denies abuse and feels  safe at home.    Social Determinants of Health   Financial Resource Strain: Not on file  Food Insecurity: Low Risk  (11/21/2022)   Received from [facility]   Hunger Vital Sign    Worried About Running Out of Food in the Last Year: Never true    Ran Out of Food in the Last Year: Never true  Transportation Needs: No Transportation Needs (11/21/2022)   Received from [facility]    In the past 12 months, has lack of reliable transportation kept you from medical appointments, meetings, work or from getting things needed for daily living? : No  Physical Activity: Not on file  Stress: Not on file  Social Connections: Not on file  Intimate Partner Violence: Not At Risk (11/14/2022)   Humiliation, Afraid, Rape, and Kick questionnaire    Fear of Current or Ex-Partner: No    Emotionally Abused: No    Physically Abused: No    Sexually Abused: No     Family History:    Family History  Problem Relation Age of Onset   Arthritis Mother    Hypertension Mother    Sleep apnea Mother    Heart disease Father    Heart disease Maternal Grandmother    Diabetes Maternal Grandmother    Sleep apnea Maternal Grandmother    Heart disease Maternal Grandfather    Diabetes Maternal Grandfather    Stroke Paternal Grandfather      ROS:  All other ROS reviewed and negative. Pertinent positives noted in the HPI.     Physical Exam/Data:   Vitals:   11/21/22 1415 11/21/22 1430 11/21/22 1459 11/21/22 1515  BP: 129/71 124/66 110/71 134/76  Pulse: (!) 50 (!) 46 (!) 52 (!) 47  Resp: 18     Temp:      TempSrc:      SpO2:    99%  Height:       No intake or output data in the 24 hours ending 11/21/22 1629     11/14/2022    5:48 AM 11/11/2022    1:59 PM 09/05/2020   12:43 AM  Last 3 Weights  Weight (lbs) 176 lb 12.8 oz 176 lb 169 lb 3.2 oz  Weight (kg) 80.196 kg 79.833 kg 76.749 kg    Body mass index is 30.35 kg/m.  General: Well nourished, well developed, in no acute  distress Head: Atraumatic, normal size  Eyes: PEERLA, EOMI  Neck: Supple, no JVD Endocrine: No thryomegaly Cardiac: Normal S1, S2; RRR; no murmurs, rubs, or gallops Lungs: Clear to auscultation bilaterally, no wheezing, rhonchi or rales  Abd: Soft, nontender, no hepatomegaly  Ext: No edema, pulses 2+ Musculoskeletal: No deformities, BUE and BLE strength  normal and equal Skin: Warm and dry, no rashes   Neuro: Alert and oriented to person, place, time, and situation, CNII-XII grossly intact, no focal deficits  Psych: Normal mood and affect   EKG:  The EKG was personally reviewed and demonstrates: Sinus bradycardia heart rate 39, narrow intervals  Relevant CV Studies: Echo pending  Laboratory Data: High Sensitivity Troponin:   Recent Labs  Lab 11/21/22 1054 11/21/22 1422  TROPONINIHS 4 11     Cardiac EnzymesNo results for input(s): "TROPONINI" in the last 168 hours. No results for input(s): "TROPIPOC" in the last 168 hours.  Chemistry Recent Labs  Lab 11/21/22 1054  NA 140  K 3.9  CL 107  CO2 22  GLUCOSE 80  BUN 14  CREATININE 0.74  CALCIUM 8.3*  GFRNONAA >60  ANIONGAP 11    Recent Labs  Lab 11/21/22 1054  PROT 6.9  ALBUMIN 3.3*  AST 20  ALT 20  ALKPHOS 82  BILITOT 0.6   Hematology Recent Labs  Lab 11/15/22 0509 11/21/22 1054  WBC 16.9* 9.7  RBC 3.56* 4.12  HGB 9.9* 11.6*  HCT 29.6* 34.8*  MCV 83.1 84.5  MCH 27.8 28.2  MCHC 33.4 33.3  RDW 17.6* 17.0*  PLT 241 296   BNPNo results for input(s): "BNP", "PROBNP" in the last 168 hours.  DDimer No results for input(s): "DDIMER" in the last 168 hours.  Radiology/Studies:  CT ANGIO HEAD NECK W WO CM  Result Date: 11/21/2022 CLINICAL DATA:  Headache.  Recent C-section. EXAM: CT ANGIOGRAPHY HEAD AND NECK WITH AND WITHOUT CONTRAST TECHNIQUE: Multidetector CT imaging of the head and neck was performed using the standard protocol during bolus administration of intravenous contrast. Multiplanar CT image  reconstructions and MIPs were obtained to evaluate the vascular anatomy. Carotid stenosis measurements (when applicable) are obtained utilizing NASCET criteria, using the distal internal carotid diameter as the denominator. RADIATION DOSE REDUCTION: This exam was performed according to the departmental dose-optimization program which includes automated exposure control, adjustment of the mA and/or kV according to patient size and/or use of iterative reconstruction technique. CONTRAST:  75mL OMNIPAQUE IOHEXOL 350 MG/ML SOLN COMPARISON:  None Available. FINDINGS: CT HEAD FINDINGS Brain: There is no acute intracranial hemorrhage, extra-axial fluid collection, or acute infarct. Parenchymal volume is normal. The ventricles are normal in size. Gray-white differentiation is preserved. The pituitary and suprasellar region are normal. There is no mass lesion. There is no mass effect or midline shift. Vascular: See below. Skull: Normal. Negative for fracture or focal lesion. Sinuses/Orbits: The paranasal sinuses are clear. The globes and orbits are unremarkable. Other: The mastoid air cells and middle ear cavities are clear. Review of the MIP images confirms the above findings CTA NECK FINDINGS Aortic arch: The imaged aortic arch is normal. The origins of the major branch vessels are patent. The subclavian arteries are patent to the level imaged. Right carotid system: The right common, internal, and external carotid arteries are patent, without stenosis or occlusion. There is no evidence of dissection or aneurysm. Left carotid system: The left common, internal, and external carotid arteries are patent, without stenosis or occlusion. There is no evidence of dissection or aneurysm. Vertebral arteries: The vertebral arteries are patent, without stenosis or occlusion. There is no evidence of dissection or aneurysm. Skeleton: There is no acute osseous abnormality or suspicious osseous lesion. There is no visible canal hematoma.  Other neck: The soft tissues of the neck are unremarkable. Upper chest: The imaged lung apices are clear. Review of  the MIP images confirms the above findings CTA HEAD FINDINGS Anterior circulation: The intracranial ICAs are normal. The bilateral MCAs and ACAS are normal. There is no aneurysm or AVM. Posterior circulation: The bilateral V4 segments are normal. The basilar artery is normal. The major cerebellar arteries appear normal. The bilateral PCAs are normal. Diminutive posterior communicating arteries are identified. There is no aneurysm or AVM. Venous sinuses: Patent. Anatomic variants: None. Review of the MIP images confirms the above findings IMPRESSION: 1. Normal noncontrast CT head. 2. Normal CTA head and neck. Electronically Signed   By: Lesia Hausen M.D.   On: 11/21/2022 12:56    Assessment and Plan:   # Sinus Bradycardia # Headache # Elevated BP -She presents with asymptomatic bradycardia found incidentally at a doctor's appointment.  She has since developed intense headache.  EKG shows sinus bradycardia heart rate 39 with narrow intervals.  There is no evidence of high-grade conduction disease or AV block.  Her heart rate increases when she gets up to go to the bathroom.  She describes no dizziness or lightheadedness.  Just intense headache. -Currently being admitted to the maternal ward for observation for possible preeclampsia. -Unclear what has triggered this episode of bradycardia but pain can certainly do this.  She may be having a migraine versus preeclampsia.  Her lab work is normal.  She is on fluoxetine at home which is not associated with bradycardia on my review of the literature.  Bradycardia is an uncommon symptom of preeclampsia but has been reported, although I agree this would be rare. -Either way, this is likely secondary.  There are no indications for pacing.  We will follow-up on TSH.  We will order an echocardiogram to be completed tomorrow. -I suspect as her acute  illness resolves the bradycardia will likely resolve.  At this point is difficult to exclude that her bradycardia is mediated by pain.  -Cardiology to follow along.  For questions or updates, please contact [facility] Please consult [email or web address] for contact info under   Signed, Gerri Spore T. Flora Lipps, MD, [facility]  [facility]  11/21/2022 4:29 PM

## 2022-11-21 NOTE — H&P (Signed)
Brittany Collins is an 32 y.o. female. Brittany Collins is S/P repeat C/S 11/14/22 and discharged on 11/16/22 doing well. Kept an allergy testing appointment today and was told her pulse was 30s. She was directed to [facility] in [place].  After arrival at [facility] she developed a left temporal and occipital HA. It was treated and gave her about 60% relief.  She denies SOB, CP, dizziness, leg pain. States until HA she felt OK.  She has a Hx of migraine HA  Pertinent Gynecological History: Menses:  Bleeding:  Contraception:  DES exposure:  Blood transfusions:  Sexually transmitted diseases:  Previous GYN Procedures:  Last mammogram: Date: Last pap: Date: OB History: G P  Menstrual History: Menarche age: No LMP recorded.    Past Medical History:  Diagnosis Date   ADHD    Allergic rhinitis    Anxiety    Chicken pox    Chronic pansinusitis    GERD (gastroesophageal reflux disease)    Headache    History of kidney stones    Migraines     Past Surgical History:  Procedure Laterality Date   CESAREAN SECTION N/A 09/02/2020   Procedure: PRIMARY CESAREAN SECTION EDC: 09-05-20 ALLERG: SULFA, TRAMADOL;  Surgeon: Marcelle Overlie, MD;  Location: [facility] LD [facility]S;  Service: Obstetrics;  Laterality: N/A;   CESAREAN SECTION N/A 11/14/2022   Procedure: REPEAT SECTION  REPEAT X1;  Surgeon: Marcelle Overlie, MD;  Location: [facility]S;  Service: Obstetrics;  Laterality: N/A;   WISDOM TOOTH EXTRACTION      Family History  Problem Relation Age of Onset   Arthritis Mother    Hypertension Mother    Sleep apnea Mother    Heart disease Father    Heart disease Maternal Grandmother    Diabetes Maternal Grandmother    Sleep apnea Maternal Grandmother    Heart disease Maternal Grandfather    Diabetes Maternal Grandfather    Stroke Paternal Grandfather     Social History:  reports that she has quit smoking. Her smoking use included cigarettes. She has a 7 pack-year smoking history. She has never used  smokeless tobacco. She reports current alcohol use. She reports that she does not use drugs.  Allergies:  Allergies  Allergen Reactions   Tramadol Other (See Comments)    headache migraines    Sulfa Antibiotics Other (See Comments)    Reaction unknown   Sulfamethoxazole     Other reaction(s): Confusion (intolerance)    Medications Prior to Admission  Medication Sig Dispense Refill Last Dose   FLUoxetine (PROZAC) 10 MG capsule Take 10 mg by mouth daily.       Review of Systems  Respiratory:  Negative for cough and shortness of breath.   Cardiovascular:  Negative for chest pain.  Neurological:  Positive for headaches.    Blood pressure 134/76, pulse (!) 47, temperature 98 F (36.7 C), temperature source Oral, resp. rate 18, height 5\' 4"  (1.626 m), SpO2 99%, currently breastfeeding. Physical Exam Cardiovascular:     Rate and Rhythm: Bradycardia present.  Pulmonary:     Effort: Pulmonary effort is normal.  Neurological:     Comments: DTR 2+     Results for orders placed or performed during the hospital encounter of 11/21/22 (from the past 24 hour(s))  CBC with Differential     Status: Abnormal   Collection Time: 11/21/22 10:54 AM  Result Value Ref Range   WBC 9.7 4.0 - 10.5 K/uL   RBC 4.12 3.87 - 5.11 MIL/uL  Hemoglobin 11.6 (L) 12.0 - 15.0 g/dL   HCT 78.2 (L) 95.6 - 21.3 %   MCV 84.5 80.0 - 100.0 fL   MCH 28.2 26.0 - 34.0 pg   MCHC 33.3 30.0 - 36.0 g/dL   RDW 08.6 (H) 57.8 - 46.9 %   Platelets 296 150 - 400 K/uL   nRBC 0.0 0.0 - 0.2 %   Neutrophils Relative % 75 %   Neutro Abs 7.2 1.7 - 7.7 K/uL   Lymphocytes Relative 19 %   Lymphs Abs 1.9 0.7 - 4.0 K/uL   Monocytes Relative 4 %   Monocytes Absolute 0.4 0.1 - 1.0 K/uL   Eosinophils Relative 1 %   Eosinophils Absolute 0.1 0.0 - 0.5 K/uL   Basophils Relative 0 %   Basophils Absolute 0.0 0.0 - 0.1 K/uL   Immature Granulocytes 1 %   Abs Immature Granulocytes 0.09 (H) 0.00 - 0.07 K/uL  Comprehensive metabolic  panel     Status: Abnormal   Collection Time: 11/21/22 10:54 AM  Result Value Ref Range   Sodium 140 135 - 145 mmol/L   Potassium 3.9 3.5 - 5.1 mmol/L   Chloride 107 98 - 111 mmol/L   CO2 22 22 - 32 mmol/L   Glucose, Bld 80 70 - 99 mg/dL   BUN 14 6 - 20 mg/dL   Creatinine, Ser 6.29 0.44 - 1.00 mg/dL   Calcium 8.3 (L) 8.9 - 10.3 mg/dL   Total Protein 6.9 6.5 - 8.1 g/dL   Albumin 3.3 (L) 3.5 - 5.0 g/dL   AST 20 15 - 41 U/L   ALT 20 0 - 44 U/L   Alkaline Phosphatase 82 38 - 126 U/L   Total Bilirubin 0.6 0.3 - 1.2 mg/dL   GFR, Estimated >52 >84 mL/min   Anion gap 11 5 - 15  Magnesium     Status: None   Collection Time: 11/21/22 10:54 AM  Result Value Ref Range   Magnesium 1.9 1.7 - 2.4 mg/dL  Troponin I (High Sensitivity)     Status: None   Collection Time: 11/21/22 10:54 AM  Result Value Ref Range   Troponin I (High Sensitivity) 4 <18 ng/L  Urinalysis, w/ Reflex to Culture (Infection Suspected) -Urine, Clean Catch     Status: Abnormal   Collection Time: 11/21/22 10:54 AM  Result Value Ref Range   Specimen Source URINE, CLEAN CATCH    Color, Urine YELLOW YELLOW   APPearance CLOUDY (A) CLEAR   Specific Gravity, Urine 1.020 1.005 - 1.030   pH 7.0 5.0 - 8.0   Glucose, UA NEGATIVE NEGATIVE mg/dL   Hgb urine dipstick SMALL (A) NEGATIVE   Bilirubin Urine NEGATIVE NEGATIVE   Ketones, ur NEGATIVE NEGATIVE mg/dL   Protein, ur NEGATIVE NEGATIVE mg/dL   Nitrite NEGATIVE NEGATIVE   Leukocytes,Ua NEGATIVE NEGATIVE   Squamous Epithelial / HPF 0-5 0 - 5 /HPF   WBC, UA NONE SEEN 0 - 5 WBC/hpf   RBC / HPF 11-20 0 - 5 RBC/hpf   Bacteria, UA RARE (A) NONE SEEN    CT ANGIO HEAD NECK W WO CM  Result Date: 11/21/2022 CLINICAL DATA:  Headache.  Recent C-section. EXAM: CT ANGIOGRAPHY HEAD AND NECK WITH AND WITHOUT CONTRAST TECHNIQUE: Multidetector CT imaging of the head and neck was performed using the standard protocol during bolus administration of intravenous contrast. Multiplanar CT image  reconstructions and MIPs were obtained to evaluate the vascular anatomy. Carotid stenosis measurements (when applicable) are obtained utilizing NASCET criteria,  using the distal internal carotid diameter as the denominator. RADIATION DOSE REDUCTION: This exam was performed according to the departmental dose-optimization program which includes automated exposure control, adjustment of the mA and/or kV according to patient size and/or use of iterative reconstruction technique. CONTRAST:  75mL OMNIPAQUE IOHEXOL 350 MG/ML SOLN COMPARISON:  None Available. FINDINGS: CT HEAD FINDINGS Brain: There is no acute intracranial hemorrhage, extra-axial fluid collection, or acute infarct. Parenchymal volume is normal. The ventricles are normal in size. Gray-white differentiation is preserved. The pituitary and suprasellar region are normal. There is no mass lesion. There is no mass effect or midline shift. Vascular: See below. Skull: Normal. Negative for fracture or focal lesion. Sinuses/Orbits: The paranasal sinuses are clear. The globes and orbits are unremarkable. Other: The mastoid air cells and middle ear cavities are clear. Review of the MIP images confirms the above findings CTA NECK FINDINGS Aortic arch: The imaged aortic arch is normal. The origins of the major branch vessels are patent. The subclavian arteries are patent to the level imaged. Right carotid system: The right common, internal, and external carotid arteries are patent, without stenosis or occlusion. There is no evidence of dissection or aneurysm. Left carotid system: The left common, internal, and external carotid arteries are patent, without stenosis or occlusion. There is no evidence of dissection or aneurysm. Vertebral arteries: The vertebral arteries are patent, without stenosis or occlusion. There is no evidence of dissection or aneurysm. Skeleton: There is no acute osseous abnormality or suspicious osseous lesion. There is no visible canal hematoma.  Other neck: The soft tissues of the neck are unremarkable. Upper chest: The imaged lung apices are clear. Review of the MIP images confirms the above findings CTA HEAD FINDINGS Anterior circulation: The intracranial ICAs are normal. The bilateral MCAs and ACAS are normal. There is no aneurysm or AVM. Posterior circulation: The bilateral V4 segments are normal. The basilar artery is normal. The major cerebellar arteries appear normal. The bilateral PCAs are normal. Diminutive posterior communicating arteries are identified. There is no aneurysm or AVM. Venous sinuses: Patent. Anatomic variants: None. Review of the MIP images confirms the above findings IMPRESSION: 1. Normal noncontrast CT head. 2. Normal CTA head and neck. Electronically Signed   By: Lesia Hausen M.D.   On: 11/21/2022 12:56    Assessment/Plan: 32 yo G2P2 5 days PO from C/S Preeclampsia with elevated BP and HA-magnesium sulfate running. Mild occipital HA remains. Consult to cardiology done Consult to MFM D/W patient  Roselle Locus II 11/21/2022, 3:59 PM

## 2022-11-21 NOTE — MAU Provider Note (Addendum)
History   Patient is a 32 yo female G2P002 s/p CS (10/17) for IUGR who presents with headache and asymptomatic bradycardia. No hx of pre eclampsia or gestational HTN. Delivery was uncomplicated and patient has felt well since delivery. Today she went to have an allergy test and they noticed her HR was in the 30s, so elected not to proceed with allergy testing and send to ED. patient initially went to med [facility] where she was found to be hypertensive and bradycardic in the 30s.  She began having severe 10/10 L sided headache.  Patient given Benadryl, Compazine, Tylenol for the headache.  She was also given 1 dose of hydralazine to treat the blood pressures.  On arrival to the MAU her blood pressures were within normal limits but heart rate was still in the 40s.  Denies vision changes, weakness, lightheadedness, abdominal pain, CP, SOB, or any other symptoms. Patient does not report any history of low Hrs, heart conditions, or thyroid disease.  Past Medical History:  Diagnosis Date   ADHD    Allergic rhinitis    Anxiety    Chicken pox    Chronic pansinusitis    GERD (gastroesophageal reflux disease)    Headache    History of kidney stones    Migraines     Past Surgical History:  Procedure Laterality Date   CESAREAN SECTION N/A 09/02/2020   Procedure: PRIMARY CESAREAN SECTION EDC: 09-05-20 ALLERG: SULFA, TRAMADOL;  Surgeon: Marcelle Overlie, MD;  Location: [facility] LD ORS;  Service: Obstetrics;  Laterality: N/A;   CESAREAN SECTION N/A 11/14/2022   Procedure: REPEAT SECTION  REPEAT X1;  Surgeon: Marcelle Overlie, MD;  Location: [facility] LD ORS;  Service: Obstetrics;  Laterality: N/A;   WISDOM TOOTH EXTRACTION      Family History  Problem Relation Age of Onset   Arthritis Mother    Hypertension Mother    Sleep apnea Mother    Heart disease Father    Heart disease Maternal Grandmother    Diabetes Maternal Grandmother    Sleep apnea Maternal Grandmother    Heart disease Maternal Grandfather     Diabetes Maternal Grandfather    Stroke Paternal Grandfather     Social History   Tobacco Use   Smoking status: Former    Current packs/day: 1.00    Average packs/day: 1 pack/day for 7.0 years (7.0 ttl pk-yrs)    Types: Cigarettes   Smokeless tobacco: Never  Vaping Use   Vaping status: Never Used  Substance Use Topics   Alcohol use: Yes    Comment: <1 per month   Drug use: No    Allergies:  Allergies  Allergen Reactions   Tramadol Other (See Comments)    headache migraines    Sulfa Antibiotics Other (See Comments)    Reaction unknown   Sulfamethoxazole     Other reaction(s): Confusion (intolerance)    Medications Prior to Admission  Medication Sig Dispense Refill Last Dose   FLUoxetine (PROZAC) 10 MG capsule Take 10 mg by mouth daily.       Review of Systems Physical Exam Blood pressure 124/66, pulse (!) 46, temperature 98 F (36.7 C), temperature source Oral, resp. rate 18, height 5\' 4"  (1.626 m), SpO2 98%, currently breastfeeding. Physical Exam Gen: alert, well appearing, in no acute distress HEENT: normocephalic, atraumatic CV: bradycardic, regular rhythm, no MRG Pulm: normal WOB, clear to auscultation bilaterally Ext: no lower extremity edema Neuro: Cranial nerves grossly intact, upper extremity strength full and equal bilaterally  MAU Course Procedures  MDM #Bradycardia #HTN #Headache Patient given IV mag and hydralazine given elevated Bps up to 160s/90s. Her headache has now improved to 4/10 with tylenol. Labs have so far been unremarkable with no signs of infection, normal liver enzymes, normal trop, no urinary ketones. CT angio head was normal. I suspect patient has post partum preeclampsia given elevated BP and bradycardia. Will admit to hospital for management of BP and mag infusion. Other possible etiology is thyroid disorder, pending TSH. PP cardiomyopathy seems unlikely based on vitals and lack of symptoms, but will consult cardiology for  further cardiac workup. I suspect her headache to continue to improve with BP control but will continue tylenol.  Attending Attestation  I saw and evaluated the patient, performing the key elements of the service.I  personally performed or re-performed the history, physical exam, and medical decision making activities of this service and have verified that the service and findings are accurately documented in the student's note. I developed the management plan that is described in the medical student's note, and I agree with the content, with my edits above.    Derrel Nip, MD Attending Family Medicine Physician, [facility] for [facility], [facility]

## 2022-11-21 NOTE — Progress Notes (Signed)
HA gone after drinking [employer]  Today's Vitals   11/21/22 1614 11/21/22 1734 11/21/22 1800 11/21/22 1900  BP:  134/65 118/78 99/67  Pulse:  (!) 51 (!) 56 90  Resp:  18 20 18   Temp:  97.7 F (36.5 C)    TempSrc:  Oral    SpO2:    98%  Height:      PainSc: 5  0-No pain     Body mass index is 30.35 kg/m.   DTR 2+  Magnesium Sulfate infusing  A/P: PP preeclampsia-appreciate MFM consult         Will D/C magnesium in am         Repeat labs in am, TSH and BNP pending         If BP needs long term treatment will consider lisinopril          Bradycardia-appreciate cardiology consult         Cardiac echo tomorrow          If HA continues despite analgesia will consult neurology, MRI to R/O PRES syndrome

## 2022-11-21 NOTE — ED Provider Notes (Signed)
[place] [facility] Provider Note   CSN: [id number] Arrival date & time: 11/21/22  1010     History  Chief Complaint  Patient presents with   Bradycardia    Brittany Collins is a 32 y.o. female.  HPI   32 year old female presents emergency department with complaints of low heart rate.  Patient is 1 week postpartum when she had C-section delivery when her daughter was born at 27 weeks.  States that she was at her allergy doctor earlier today and found with heart rates in the upper 30s/low 40s and told to come to emergency department for assessment.  Patient states that she is having no symptoms and feels at baseline.  Denies any chest pain, shortness of breath, dizziness, lightheadedness, feelings of faintness, fatigue.  Patient states that she only takes Prozac and she has been on it for some time.  Denies any fever, chills, nausea, vomiting, abdominal pain, urinary symptoms, change in bowel habits.  Past medical history significant for GERD, allergic rhinitis, ADHD, chronic pansinusitis, kidney stone,  Home Medications Prior to Admission medications   Medication Sig Start Date End Date Taking? Authorizing Provider  FLUoxetine (PROZAC) 10 MG capsule Take 10 mg by mouth daily.    [provider]      Allergies    Tramadol, Sulfa antibiotics, and Sulfamethoxazole    Review of Systems   Review of Systems  All other systems reviewed and are negative.   Physical Exam Updated Vital Signs BP 122/70 (BP Location: Left Arm)   Pulse 62   Temp 98.1 F (36.7 C) (Oral)   Resp 17   Ht 5\' 4"  (1.626 m)   SpO2 99%   Breastfeeding Yes   BMI 30.35 kg/m  Physical Exam Vitals and nursing note reviewed.  Constitutional:      General: She is not in acute distress.    Appearance: She is well-developed.  HENT:     Head: Normocephalic and atraumatic.  Eyes:     Conjunctiva/sclera: Conjunctivae normal.  Cardiovascular:     Rate and  Rhythm: Regular rhythm. Bradycardia present.     Heart sounds: No murmur heard. Pulmonary:     Effort: Pulmonary effort is normal. No respiratory distress.     Breath sounds: Normal breath sounds. No wheezing, rhonchi or rales.  Abdominal:     Palpations: Abdomen is soft.     Tenderness: There is no abdominal tenderness. There is no guarding.  Musculoskeletal:        General: No swelling.     Cervical back: Neck supple.     Right lower leg: No edema.     Left lower leg: No edema.  Skin:    General: Skin is warm and dry.     Capillary Refill: Capillary refill takes less than 2 seconds.     Comments: C-section incision site well-approximated without edema, palpable fluctuance/induration.  No overlying tenderness.  Neurological:     Mental Status: She is alert.  Psychiatric:        Mood and Affect: Mood normal.     ED Results / Procedures / Treatments   Labs (all labs ordered are listed, but only abnormal results are displayed) Labs Reviewed  CBC WITH DIFFERENTIAL/PLATELET - Abnormal; Notable for the following components:      Result Value   Hemoglobin 11.6 (*)    HCT 34.8 (*)    RDW 17.0 (*)    Abs Immature Granulocytes 0.09 (*)  All other components within normal limits  COMPREHENSIVE METABOLIC PANEL - Abnormal; Notable for the following components:   Calcium 8.3 (*)    Albumin 3.3 (*)    All other components within normal limits  URINALYSIS, W/ REFLEX TO CULTURE (INFECTION SUSPECTED) - Abnormal; Notable for the following components:   APPearance CLOUDY (*)    Hgb urine dipstick SMALL (*)    Bacteria, UA RARE (*)    All other components within normal limits  TSH - Abnormal; Notable for the following components:   TSH 0.345 (*)    All other components within normal limits  COMPREHENSIVE METABOLIC PANEL - Abnormal; Notable for the following components:   Potassium 3.3 (*)    CO2 21 (*)    Glucose, Bld 102 (*)    Calcium 6.6 (*)    Total Protein 5.8 (*)    Albumin  2.8 (*)    All other components within normal limits  CBC - Abnormal; Notable for the following components:   RDW 17.1 (*)    All other components within normal limits  MAGNESIUM - Abnormal; Notable for the following components:   Magnesium 6.3 (*)    All other components within normal limits  MAGNESIUM  TROPONIN I (HIGH SENSITIVITY)  TROPONIN I (HIGH SENSITIVITY)    EKG EKG Interpretation Date/Time:  Thursday November 21 2022 10:22:06 EDT Ventricular Rate:  39 PR Interval:  119 QRS Duration:  105 QT Interval:  488 QTC Calculation: 393 R Axis:   92  Text Interpretation: Sinus bradycardia Borderline short PR interval Borderline right axis deviation Nonspecific T abnrm, anterolateral leads Since prior ECG in 2014, heart rate has decreased, nonspecific TW changes present Confirmed by Alvira Monday ([id number]) on 11/21/2022 10:31:27 AM  Radiology ECHOCARDIOGRAM COMPLETE  Result Date: 11/22/2022    ECHOCARDIOGRAM REPORT   Patient Name:   Brittany Collins Date of Exam: 11/22/2022 Medical Rec #:  [id number]          Height:       64.0 in Accession #:    [id number]         Weight:       176.8 lb Date of Birth:  Oct 28, 1990          BSA:          1.856 m Patient Age:    32 years           BP:           124/73 mmHg Patient Gender: F                  HR:           66 bpm. Exam Location:  Inpatient Procedure: 2D Echo, Cardiac Doppler and Color Doppler Indications:    Bradycardia  History:        Patient has no prior history of Echocardiogram examinations.                 Preeclampsia in postpartum period; Risk Factors:Former Smoker.  Sonographer:    Delcie Roch RDCS Referring Phys: [id number] Ronnald Ramp O'NEAL IMPRESSIONS  1. Left ventricular ejection fraction, by estimation, is 60 to 65%. The left ventricle has normal function. The left ventricle has no regional wall motion abnormalities. Left ventricular diastolic parameters were normal.  2. Right ventricular systolic function is normal. The  right ventricular size is normal. There is normal pulmonary artery systolic pressure.  3. The mitral valve is normal in structure. Trivial mitral valve regurgitation. No  evidence of mitral stenosis.  4. The aortic valve is tricuspid. Aortic valve regurgitation is not visualized. No aortic stenosis is present.  5. The inferior vena cava is normal in size with greater than 50% respiratory variability, suggesting right atrial pressure of 3 mmHg. Comparison(s): No prior Echocardiogram. FINDINGS  Left Ventricle: Left ventricular ejection fraction, by estimation, is 60 to 65%. The left ventricle has normal function. The left ventricle has no regional wall motion abnormalities. The left ventricular internal cavity size was normal in size. There is  no left ventricular hypertrophy. Left ventricular diastolic parameters were normal. Right Ventricle: The right ventricular size is normal. No increase in right ventricular wall thickness. Right ventricular systolic function is normal. There is normal pulmonary artery systolic pressure. The tricuspid regurgitant velocity is 1.89 m/s, and  with an assumed right atrial pressure of 3 mmHg, the estimated right ventricular systolic pressure is 17.3 mmHg. Left Atrium: Left atrial size was normal in size. Right Atrium: Right atrial size was normal in size. Pericardium: There is no evidence of pericardial effusion. Mitral Valve: The mitral valve is normal in structure. Trivial mitral valve regurgitation. No evidence of mitral valve stenosis. Tricuspid Valve: The tricuspid valve is normal in structure. Tricuspid valve regurgitation is trivial. No evidence of tricuspid stenosis. Aortic Valve: The aortic valve is tricuspid. Aortic valve regurgitation is not visualized. No aortic stenosis is present. Pulmonic Valve: The pulmonic valve was normal in structure. Pulmonic valve regurgitation is not visualized. No evidence of pulmonic stenosis. Aorta: The aortic root and ascending aorta are  structurally normal, with no evidence of dilitation. Venous: The inferior vena cava is normal in size with greater than 50% respiratory variability, suggesting right atrial pressure of 3 mmHg. IAS/Shunts: The atrial septum is grossly normal.  LEFT VENTRICLE PLAX 2D LVIDd:         4.90 cm   Diastology LVIDs:         3.20 cm   LV e' medial:    9.79 cm/s LV PW:         0.90 cm   LV E/e' medial:  9.7 LV IVS:        0.90 cm   LV e' lateral:   12.80 cm/s LVOT diam:     1.90 cm   LV E/e' lateral: 7.4 LV SV:         71 LV SV Index:   38 LVOT Area:     2.84 cm  RIGHT VENTRICLE             IVC RV Basal diam:  2.50 cm     IVC diam: 2.10 cm RV S prime:     13.30 cm/s TAPSE (M-mode): 3.0 cm LEFT ATRIUM             Index        RIGHT ATRIUM           Index LA diam:        3.60 cm 1.94 cm/m   RA Area:     11.60 cm LA Vol (A2C):   42.3 ml 22.78 ml/m  RA Volume:   24.90 ml  13.41 ml/m LA Vol (A4C):   50.0 ml 26.93 ml/m LA Biplane Vol: 48.9 ml 26.34 ml/m  AORTIC VALVE LVOT Vmax:   118.00 cm/s LVOT Vmean:  76.400 cm/s LVOT VTI:    0.252 m  AORTA Ao Root diam: 2.90 cm Ao Asc diam:  2.90 cm MITRAL VALVE  TRICUSPID VALVE MV Area (PHT): 3.72 cm    TR Peak grad:   14.3 mmHg MV Decel Time: 204 msec    TR Vmax:        189.00 cm/s MV E velocity: 94.70 cm/s MV A velocity: 50.10 cm/s  SHUNTS MV E/A ratio:  1.89        Systemic VTI:  0.25 m                            Systemic Diam: 1.90 cm Sunit Tolia Electronically signed by Tessa Lerner Signature Date/Time: 11/22/2022/1:09:59 PM    Final    CT ANGIO HEAD NECK W WO CM  Result Date: 11/21/2022 CLINICAL DATA:  Headache.  Recent C-section. EXAM: CT ANGIOGRAPHY HEAD AND NECK WITH AND WITHOUT CONTRAST TECHNIQUE: Multidetector CT imaging of the head and neck was performed using the standard protocol during bolus administration of intravenous contrast. Multiplanar CT image reconstructions and MIPs were obtained to evaluate the vascular anatomy. Carotid stenosis measurements  (when applicable) are obtained utilizing NASCET criteria, using the distal internal carotid diameter as the denominator. RADIATION DOSE REDUCTION: This exam was performed according to the departmental dose-optimization program which includes automated exposure control, adjustment of the mA and/or kV according to patient size and/or use of iterative reconstruction technique. CONTRAST:  75mL OMNIPAQUE IOHEXOL 350 MG/ML SOLN COMPARISON:  None Available. FINDINGS: CT HEAD FINDINGS Brain: There is no acute intracranial hemorrhage, extra-axial fluid collection, or acute infarct. Parenchymal volume is normal. The ventricles are normal in size. Gray-white differentiation is preserved. The pituitary and suprasellar region are normal. There is no mass lesion. There is no mass effect or midline shift. Vascular: See below. Skull: Normal. Negative for fracture or focal lesion. Sinuses/Orbits: The paranasal sinuses are clear. The globes and orbits are unremarkable. Other: The mastoid air cells and middle ear cavities are clear. Review of the MIP images confirms the above findings CTA NECK FINDINGS Aortic arch: The imaged aortic arch is normal. The origins of the major branch vessels are patent. The subclavian arteries are patent to the level imaged. Right carotid system: The right common, internal, and external carotid arteries are patent, without stenosis or occlusion. There is no evidence of dissection or aneurysm. Left carotid system: The left common, internal, and external carotid arteries are patent, without stenosis or occlusion. There is no evidence of dissection or aneurysm. Vertebral arteries: The vertebral arteries are patent, without stenosis or occlusion. There is no evidence of dissection or aneurysm. Skeleton: There is no acute osseous abnormality or suspicious osseous lesion. There is no visible canal hematoma. Other neck: The soft tissues of the neck are unremarkable. Upper chest: The imaged lung apices are clear.  Review of the MIP images confirms the above findings CTA HEAD FINDINGS Anterior circulation: The intracranial ICAs are normal. The bilateral MCAs and ACAS are normal. There is no aneurysm or AVM. Posterior circulation: The bilateral V4 segments are normal. The basilar artery is normal. The major cerebellar arteries appear normal. The bilateral PCAs are normal. Diminutive posterior communicating arteries are identified. There is no aneurysm or AVM. Venous sinuses: Patent. Anatomic variants: None. Review of the MIP images confirms the above findings IMPRESSION: 1. Normal noncontrast CT head. 2. Normal CTA head and neck. Electronically Signed   By: Lesia Hausen M.D.   On: 11/21/2022 12:56    Procedures Procedures    Medications Ordered in ED Medications  acetaminophen (TYLENOL) tablet 1,000 mg (1,000 mg  Oral Given 11/22/22 1113)  lactated ringers infusion (0 mLs Intravenous Stopped 11/22/22 0600)  hydrALAZINE (APRESOLINE) injection 5 mg (has no administration in time range)    And  hydrALAZINE (APRESOLINE) injection 10 mg (has no administration in time range)    And  labetalol (NORMODYNE) injection 20 mg (has no administration in time range)    And  labetalol (NORMODYNE) injection 40 mg (has no administration in time range)  magnesium sulfate 40 grams in SWI 1000 mL OB infusion (0 g/hr Intravenous Stopped 11/22/22 0600)  FLUoxetine (PROZAC) capsule 10 mg (10 mg Oral Given 11/22/22 0925)  iohexol (OMNIPAQUE) 350 MG/ML injection 100 mL (75 mLs Intravenous Contrast Given 11/21/22 1215)  prochlorperazine (COMPAZINE) injection 5 mg (5 mg Intravenous Given 11/21/22 1236)  diphenhydrAMINE (BENADRYL) injection 12.5 mg (12.5 mg Intravenous Given 11/21/22 1236)  magnesium bolus via infusion 4 g (4 g Intravenous Bolus from Bag 11/21/22 1255)  ibuprofen (ADVIL) tablet 800 mg (800 mg Oral Given 11/21/22 1613)  potassium chloride SA (KLOR-CON M) CR tablet 40 mEq (40 mEq Oral Given 11/22/22 1113)    ED  Course/ Medical Decision Making/ A&P Clinical Course as of 11/22/22 1841  Thu Nov 21, 2022  1214 While waiting for the labs, patient developed sudden onset temporal headache.  Repeat exam showed nonfocal neurologic exam.  Given persistently elevated blood pressures above 160s as well as acute onset headache, concern for preeclampsia.  Will consult OB/GYN and obtain imaging of patient's neck and head given sudden onset headache for rule out of aneurysmal versus dissection versus other intracranial etiology. [CR]  1219 Consulted Dr. Henderson Cloud of OB/GYN who recommended transfer to MAU for further assessment/evaluation.  Recommended holding administration of magnesium sulfate bolus at this time.  [facility] called for transport. [CR]  1239 Dr. Henderson Cloud of OB/GYN consulted again and recommended beginning of magnesium sulfate as well as blood pressure control in the form of hydralazine. [CR]    Clinical Course User Index [CR] Peter Garter, PA                                 Medical Decision Making Amount and/or Complexity of Data Reviewed Labs: ordered. Radiology: ordered.  Risk OTC drugs. Prescription drug management.   This patient presents to the ED for concern of bradycardia, this involves an extensive number of treatment options, and is a complaint that carries with it a high risk of complications and morbidity.  The differential diagnosis includes physiological bradycardia after pregnancy, preeclampsia, hypothyroidism, electrolyte derangement, ACS, myocarditis, medication side effect, arrhythmia, other   Co morbidities that complicate the patient evaluation  See HPI   Additional history obtained:  Additional history obtained from EMR External records from outside source obtained and reviewed including hospital records   Lab Tests:  I Ordered, and personally interpreted labs.  The pertinent results include: No leukocytosis.  Evidence of anemia with a hemoglobin 11.6.  Platelets  within range.  No electrolyte abnormalities besides mild hypocalcemia of 8.3.  No transaminitis.  No renal dysfunction.   Imaging Studies ordered:  N/a   Cardiac Monitoring: / EKG:  The patient was maintained on a cardiac monitor.  I personally viewed and interpreted the cardiac monitored which showed an underlying rhythm of: Sinus bradycardia with short PR.  Nonspecific T wave abnormalities in anterior lateral leads.  Consultations Obtained:  See ED course  Problem List / ED Course / Critical interventions / Medication management  Preeclampsia, bradycardia I ordered medication including hydralazine, Tylenol, Benadryl, Compazine, magnesium sulfate   Reevaluation of the patient after these medicines showed that the patient improved I have reviewed the patients home medicines and have made adjustments as needed   Social Determinants of Health:  Former cigarette use.  Denies illicit drug use.   Test / Admission - Considered:  Preeclampsia, bradycardia Vitals signs significant for bradycardia with heart rate in the 40s, hypertension with blood pressures in the 160s over 80s. Otherwise within normal range and stable throughout visit. Laboratory studies significant for: See above 32 year old female presents emergency department with asymptomatic bradycardia.  Patient found with evidence of heart rates in the 30s and 40s despite not eliciting any symptoms of dizziness, lightheadedness, shortness of breath, other.  While waiting for basic laboratory studies, patient developed acute onset left-sided headache.  Patient with nonfocal neurologic exam at that time.  Trending of patient's blood pressures showed 2+ hours of blood pressures in the 160s over 80s systolic.  Patients presentation concerning for postpartum preeclampsia.  OB/GYN was consulted regarding the patient's prior to labs have resulted who recommended transfer to MAU and subsequently recommended administration of magnesium  sulfate as well as hydralazine for blood pressure control given continued bradycardia.  Patient treated with medications and transferred to MAU given concern for preeclampsia as well as continued bradycardia. Treatment plan were discussed at length with patient and they knowledge understanding was agreeable to said plan.  Appropriate consultations were made as described in the ED course.  Patient was stable upon admission to the hospital.         Final Clinical Impression(s) / ED Diagnoses Final diagnoses:  Bradycardia  Preeclampsia in postpartum period    Rx / DC Orders ED Discharge Orders     None         Peter Garter, [place] 11/22/22 Luiz Iron    Alvira Monday, MD 11/25/22 [phone]

## 2022-11-21 NOTE — ED Triage Notes (Addendum)
Pt went for allergy testing today and was told her heart rate was in the 30's.  Pt is asymptomatic.  Denies chest pain, sob or dizziness.  HR 46 in triage.  Pt post-partum.  Pt had c-section.  Born 37 weeks.   Denies taking any OTC meds except tylenol.

## 2022-11-22 ENCOUNTER — Inpatient Hospital Stay (HOSPITAL_COMMUNITY): Payer: Commercial Managed Care - PPO

## 2022-11-22 DIAGNOSIS — R001 Bradycardia, unspecified: Secondary | ICD-10-CM | POA: Diagnosis not present

## 2022-11-22 LAB — CBC
HCT: 36.7 % (ref 36.0–46.0)
Hemoglobin: 12.5 g/dL (ref 12.0–15.0)
MCH: 28.3 pg (ref 26.0–34.0)
MCHC: 34.1 g/dL (ref 30.0–36.0)
MCV: 83 fL (ref 80.0–100.0)
Platelets: 340 10*3/uL (ref 150–400)
RBC: 4.42 MIL/uL (ref 3.87–5.11)
RDW: 17.1 % — ABNORMAL HIGH (ref 11.5–15.5)
WBC: 9.7 10*3/uL (ref 4.0–10.5)
nRBC: 0 % (ref 0.0–0.2)

## 2022-11-22 LAB — COMPREHENSIVE METABOLIC PANEL
ALT: 19 U/L (ref 0–44)
AST: 16 U/L (ref 15–41)
Albumin: 2.8 g/dL — ABNORMAL LOW (ref 3.5–5.0)
Alkaline Phosphatase: 71 U/L (ref 38–126)
Anion gap: 14 (ref 5–15)
BUN: 11 mg/dL (ref 6–20)
CO2: 21 mmol/L — ABNORMAL LOW (ref 22–32)
Calcium: 6.6 mg/dL — ABNORMAL LOW (ref 8.9–10.3)
Chloride: 106 mmol/L (ref 98–111)
Creatinine, Ser: 0.87 mg/dL (ref 0.44–1.00)
GFR, Estimated: >60 mL/min (ref >60)
Glucose, Bld: 102 mg/dL — ABNORMAL HIGH (ref 70–99)
Potassium: 3.3 mmol/L — ABNORMAL LOW (ref 3.5–5.1)
Sodium: 141 mmol/L (ref 135–145)
Total Bilirubin: 0.3 mg/dL (ref 0.3–1.2)
Total Protein: 5.8 g/dL — ABNORMAL LOW (ref 6.5–8.1)

## 2022-11-22 LAB — ECHOCARDIOGRAM COMPLETE
Area-P 1/2: 3.72 cm2
Height: 64 in
S' Lateral: 3.2 cm

## 2022-11-22 LAB — MAGNESIUM: Magnesium: 6.3 mg/dL (ref 1.7–2.4)

## 2022-11-22 LAB — TSH: TSH: 0.345 u[IU]/mL — ABNORMAL LOW (ref 0.350–4.500)

## 2022-11-22 MED ORDER — POTASSIUM CHLORIDE CRYS ER 20 MEQ PO TBCR
40.0000 meq | EXTENDED_RELEASE_TABLET | Freq: Once | ORAL | Status: AC
  Administered 2022-11-22: 40 meq via ORAL
  Filled 2022-11-22: qty 2

## 2022-11-22 NOTE — Discharge Summary (Signed)
OBGYN Discharge Summary  Brittany Collins is a 32 y.o. female s/p repeat Cesarean section on 10/17 (presented on postpartum day 7).  She was seen for an outpatient allergy appointment, at which she was noted to be bradycardic to the 30-40s. She was sent to [facility] and transferred to [facility].  At [facility] she developed a headache.  Blood pressures were briefly elevated.  She was admitted for cardiology consult and postpartum preeclampsia treatment.  She received magnesium infusion.  She underwent EKG which revealed sinus bradycardia, as well as echocardiogram, and CT Head. Her bradycardia and hypertension resolved spontaneously.  Her headache resolved as well. She felt quite well and desired discharge home.  After discontinuation of magnesium, her blood pressures and heart rate remained within normal range and her symptoms did not return.  Cardiology signed off and did not recommend outpatient follow up.   Strict return precautions were given.  She was discharged home in stable condition with plans for in-office vitals check within 1 week.  Hemoglobin  Date Value Ref Range Status  11/22/2022 12.5 12.0 - 15.0 g/dL Final  16/10/9602 54.0 11.1 - 15.9 g/dL Final   HCT  Date Value Ref Range Status  11/22/2022 36.7 36.0 - 46.0 % Final   Hematocrit  Date Value Ref Range Status  05/25/2019 36.3 34.0 - 46.6 % Final    Physical Exam:  Gen: alert, well appearing, no distress Chest: nonlabored breathing CV: no peripheral edema Abdomen: soft, nondistended Ext: no evidence of DVT  Discharge Diagnoses: bradycardia, resolved  Discharge Information: Date: 11/22/2022 Activity: Pelvic rest, as tolerated Diet: routine Medications: Tylenol, motrin Condition: stable Instructions: Discussed prior to discharge.  Discharge to: Home  Follow-up Information     , [facility] Follow up.   Why: Please follow up for a blood pressure check next week, the office will  reach out to schedule. Contact information: [address] [place] [address] [phone]                  Lyn Henri 11/22/2022, 8:21 PM

## 2022-11-22 NOTE — Plan of Care (Signed)
  Problem: Education: Goal: Knowledge of General Education information will improve Description: Including pain rating scale, medication(s)/side effects and non-pharmacologic comfort measures Outcome: Progressing   Problem: Health Behavior/Discharge Planning: Goal: Ability to manage health-related needs will improve Outcome: Progressing   Problem: Clinical Measurements: Goal: Ability to maintain clinical measurements within normal limits will improve Outcome: Progressing Goal: Will remain free from infection Outcome: Progressing Goal: Diagnostic test results will improve Outcome: Progressing Goal: Respiratory complications will improve Outcome: Progressing Goal: Cardiovascular complication will be avoided Outcome: Progressing   Problem: Activity: Goal: Risk for activity intolerance will decrease Outcome: Progressing   Problem: Nutrition: Goal: Adequate nutrition will be maintained Outcome: Progressing   Problem: Coping: Goal: Level of anxiety will decrease Outcome: Progressing   Problem: Elimination: Goal: Will not experience complications related to bowel motility Outcome: Progressing Goal: Will not experience complications related to urinary retention Outcome: Progressing   Problem: Pain Management: Goal: General experience of comfort will improve Outcome: Progressing   Problem: Safety: Goal: Ability to remain free from injury will improve Outcome: Progressing   Problem: Skin Integrity: Goal: Risk for impaired skin integrity will decrease Outcome: Progressing   Problem: Education: Goal: Knowledge of disease or condition will improve Outcome: Progressing Goal: Knowledge of the prescribed therapeutic regimen will improve Outcome: Progressing   Problem: Fluid Volume: Goal: Peripheral tissue perfusion will improve Outcome: Progressing   Problem: Clinical Measurements: Goal: Complications related to disease process, condition or treatment will be avoided or  minimized Outcome: Progressing

## 2022-11-22 NOTE — Progress Notes (Addendum)
Postpartum Progress Note  Brittany Collins is a 32 yo female s/p RCS on 10/17.  She was re-admitted for bradycardia noted at outpatient allergy test. She was sent to [facility] and transferred to [facility].  She is admitted for postpartum preeclampsia. She underwent magnesium infusion, antihypertensives, cardiology consult.  Subjective:  Patient reports no overnight events.  Headache has resolved. She denies vision changes.  She feels improved since discontinuation of magnesium. She denies chest pain, SOB, lightheadedness, dizziness. Pain is well controlled.   Objective: Blood pressure 107/78, pulse 81, temperature 97.8 F (36.6 C), temperature source Oral, resp. rate 18, height 5\' 4"  (1.626 m), SpO2 98%, currently breastfeeding.  Physical Exam:  General: alert and no distress Lochia: appropriate Abdomen: soft, ATTP Uterine Fundus: firm Incision: clean/dry/intact DVT Evaluation: No evidence of DVT seen on physical exam.  Recent Labs    11/21/22 1054 11/22/22 0532  HGB 11.6* 12.5  HCT 34.8* 36.7    Assessment/Plan: Postpartum Preeclampsia Magnesium infusion complete Headache resolved yesterday evening Normotensive overnight, will hold of on additional antihypertensives If headache returns, may consider MRI to evaluate for PRES Bradycardia - resolved Appreciate cardiology consult Echocardiogram planned today 1 pm K+ 3.3 this AM, PO Kcl ordered. Dispo:  symptoms resolved and vitals normalized.  Will await echo and cardiology recommendations regarding discharge planning.    LOS: 1 day   Lyn Henri 11/22/2022, 7:21 AM

## 2022-11-22 NOTE — Progress Notes (Signed)
  Echocardiogram 2D Echocardiogram has been performed.  Brittany Collins 11/22/2022, 12:59 PM

## 2022-11-22 NOTE — Progress Notes (Signed)
Cardiology Progress Note  Patient ID: Brittany Collins MRN: [id number] DOB: [birth year] Date of Encounter: 11/22/2022 Primary Cardiologist: None  Subjective   Chief Complaint: Headache  HPI: Heart rate in the 60s.  Still with headache.  No dizziness or lightheadedness.  No chest pain or trouble breathing.  ROS:  All other ROS reviewed and negative. Pertinent positives noted in the HPI.     Vital Signs   Vitals:   11/22/22 0344 11/22/22 0500 11/22/22 0600 11/22/22 0738  BP: 107/78   118/68  Pulse: 81   64  Resp: 18 17 18 17   Temp: 97.8 F (36.6 C)   98.1 F (36.7 C)  TempSrc: Oral   Oral  SpO2: 98%   100%  Height:        Intake/Output Summary (Last 24 hours) at 11/22/2022 1032 Last data filed at 11/22/2022 0739 Gross per 24 hour  Intake 3260 ml  Output 2600 ml  Net 660 ml      11/14/2022    5:48 AM 11/11/2022    1:59 PM 09/05/2020   12:43 AM  Last 3 Weights  Weight (lbs) 176 lb 12.8 oz 176 lb 169 lb 3.2 oz  Weight (kg) 80.196 kg 79.833 kg 76.749 kg       ECG  The most recent ECG shows SB 39 bpm, which I personally reviewed.   Physical Exam   Vitals:   11/22/22 0344 11/22/22 0500 11/22/22 0600 11/22/22 0738  BP: 107/78   118/68  Pulse: 81   64  Resp: 18 17 18 17   Temp: 97.8 F (36.6 C)   98.1 F (36.7 C)  TempSrc: Oral   Oral  SpO2: 98%   100%  Height:        Intake/Output Summary (Last 24 hours) at 11/22/2022 1032 Last data filed at 11/22/2022 0739 Gross per 24 hour  Intake 3260 ml  Output 2600 ml  Net 660 ml       11/14/2022    5:48 AM 11/11/2022    1:59 PM 09/05/2020   12:43 AM  Last 3 Weights  Weight (lbs) 176 lb 12.8 oz 176 lb 169 lb 3.2 oz  Weight (kg) 80.196 kg 79.833 kg 76.749 kg    Body mass index is 30.35 kg/m.  General: Well nourished, well developed, in no acute distress Head: Atraumatic, normal size  Eyes: PEERLA, EOMI  Neck: Supple, no JVD Endocrine: No thryomegaly Cardiac: Normal S1, S2; RRR; no murmurs, rubs, or  gallops Lungs: Clear to auscultation bilaterally, no wheezing, rhonchi or rales  Abd: Soft, nontender, no hepatomegaly  Ext: No edema, pulses 2+ Musculoskeletal: No deformities, BUE and BLE strength normal and equal Skin: Warm and dry, no rashes   Neuro: Alert and oriented to person, place, time, and situation, CNII-XII grossly intact, no focal deficits  Psych: Normal mood and affect   Cardiac Studies  Echo pending   Patient Profile  Brittany Collins is a 32 y.o. female with history of ADHD and recent pregnancy admitted on 11/21/2022 with headache postpartum.  Cardiology consulted for sinus bradycardia.  Assessment & Plan   # Sinus bradycardia -Admitted with headache and concerns for preeclampsia.  Noted to have sinus bradycardia in the 30 to 40 bpm range.  She reported no dizziness or lightheadedness.  I believe this is secondary to her headache.  -Headache is improved.  Heart rate in the 60 to 70 bpm range. -TSH is pending. Would make sure this is performed.  -She did have an  echo ordered and this is pending as well.  Assuming this is normal she can be discharged.  We will follow up on this.  I think it will be very normal.  She has no cardiac complaints.  Her bradycardia has a clear secondary explanation. -Cardiology to follow-up with OB provider after ultrasound is performed.   [facility] will sign off.   Medication Recommendations:  none Other recommendations (labs, testing, etc):  none Follow up as an outpatient:  none needed as long as echo is normal.   For questions or updates, please contact [facility] Please consult [email or web address] for contact info under        Signed, Gerri Spore T. Flora Lipps, MD, [facility] [place]  [facility]  11/22/2022 10:32 AM

## 2022-11-22 NOTE — Plan of Care (Signed)
  Problem: Education: Goal: Knowledge of General Education information will improve Description: Including pain rating scale, medication(s)/side effects and non-pharmacologic comfort measures 11/22/2022 1631 by Samuella Cota, RN Outcome: Completed/Met 11/22/2022 0815 by Samuella Cota, RN Outcome: Progressing   Problem: Health Behavior/Discharge Planning: Goal: Ability to manage health-related needs will improve 11/22/2022 1631 by Samuella Cota, RN Outcome: Completed/Met 11/22/2022 0815 by Samuella Cota, RN Outcome: Progressing   Problem: Clinical Measurements: Goal: Ability to maintain clinical measurements within normal limits will improve 11/22/2022 1631 by Samuella Cota, RN Outcome: Completed/Met 11/22/2022 0815 by Samuella Cota, RN Outcome: Progressing Goal: Will remain free from infection 11/22/2022 1631 by Samuella Cota, RN Outcome: Completed/Met 11/22/2022 0815 by Samuella Cota, RN Outcome: Progressing Goal: Diagnostic test results will improve 11/22/2022 1631 by Samuella Cota, RN Outcome: Completed/Met 11/22/2022 0815 by Samuella Cota, RN Outcome: Progressing Goal: Respiratory complications will improve 11/22/2022 1631 by Samuella Cota, RN Outcome: Completed/Met 11/22/2022 0815 by Samuella Cota, RN Outcome: Progressing Goal: Cardiovascular complication will be avoided 11/22/2022 1631 by Samuella Cota, RN Outcome: Completed/Met 11/22/2022 0815 by Samuella Cota, RN Outcome: Progressing   Problem: Activity: Goal: Risk for activity intolerance will decrease 11/22/2022 1631 by Samuella Cota, RN Outcome: Completed/Met 11/22/2022 0815 by Samuella Cota, RN Outcome: Progressing   Problem: Nutrition: Goal: Adequate nutrition will be maintained 11/22/2022 1631 by Samuella Cota, RN Outcome: Completed/Met 11/22/2022 0815 by Samuella Cota, RN Outcome: Progressing   Problem: Coping: Goal: Level of anxiety will decrease 11/22/2022 1631 by Samuella Cota, RN Outcome:  Completed/Met 11/22/2022 0815 by Samuella Cota, RN Outcome: Progressing   Problem: Elimination: Goal: Will not experience complications related to bowel motility 11/22/2022 1631 by Samuella Cota, RN Outcome: Completed/Met 11/22/2022 0815 by Samuella Cota, RN Outcome: Progressing Goal: Will not experience complications related to urinary retention 11/22/2022 1631 by Samuella Cota, RN Outcome: Completed/Met 11/22/2022 0815 by Samuella Cota, RN Outcome: Progressing   Problem: Pain Management: Goal: General experience of comfort will improve 11/22/2022 1631 by Samuella Cota, RN Outcome: Completed/Met 11/22/2022 0815 by Samuella Cota, RN Outcome: Progressing   Problem: Safety: Goal: Ability to remain free from injury will improve 11/22/2022 1631 by Samuella Cota, RN Outcome: Completed/Met 11/22/2022 0815 by Samuella Cota, RN Outcome: Progressing   Problem: Skin Integrity: Goal: Risk for impaired skin integrity will decrease 11/22/2022 1631 by Samuella Cota, RN Outcome: Completed/Met 11/22/2022 0815 by Samuella Cota, RN Outcome: Progressing   Problem: Education: Goal: Knowledge of disease or condition will improve 11/22/2022 1631 by Samuella Cota, RN Outcome: Completed/Met 11/22/2022 0815 by Samuella Cota, RN Outcome: Progressing Goal: Knowledge of the prescribed therapeutic regimen will improve 11/22/2022 1631 by Samuella Cota, RN Outcome: Completed/Met 11/22/2022 0815 by Samuella Cota, RN Outcome: Progressing   Problem: Fluid Volume: Goal: Peripheral tissue perfusion will improve 11/22/2022 1631 by Samuella Cota, RN Outcome: Completed/Met 11/22/2022 0815 by Samuella Cota, RN Outcome: Progressing   Problem: Clinical Measurements: Goal: Complications related to disease process, condition or treatment will be avoided or minimized 11/22/2022 1631 by Samuella Cota, RN Outcome: Completed/Met 11/22/2022 0815 by Samuella Cota, RN Outcome: Progressing

## 2022-11-24 ENCOUNTER — Inpatient Hospital Stay (HOSPITAL_COMMUNITY)
Admission: AD | Admit: 2022-11-24 | Discharge: 2022-11-24 | Disposition: A | Discharge status: Home / Self Care | Payer: Commercial Managed Care - PPO | Attending: Obstetrics and Gynecology | Admitting: Obstetrics and Gynecology

## 2022-11-24 ENCOUNTER — Other Ambulatory Visit: Payer: Self-pay

## 2022-11-24 ENCOUNTER — Encounter (HOSPITAL_COMMUNITY): Payer: Self-pay

## 2022-11-24 DIAGNOSIS — Z87891 Personal history of nicotine dependence: Secondary | ICD-10-CM | POA: Insufficient documentation

## 2022-11-24 DIAGNOSIS — Z4889 Encounter for other specified surgical aftercare: Secondary | ICD-10-CM | POA: Insufficient documentation

## 2022-11-24 DIAGNOSIS — O165 Unspecified maternal hypertension, complicating the puerperium: Secondary | ICD-10-CM | POA: Diagnosis not present

## 2022-11-24 LAB — CBC WITH DIFFERENTIAL/PLATELET
Abs Immature Granulocytes: 0.02 10*3/uL (ref 0.00–0.07)
Basophils Absolute: 0 10*3/uL (ref 0.0–0.1)
Basophils Relative: 0 %
Eosinophils Absolute: 0.1 10*3/uL (ref 0.0–0.5)
Eosinophils Relative: 2 %
HCT: 34.7 % — ABNORMAL LOW (ref 36.0–46.0)
Hemoglobin: 11.4 g/dL — ABNORMAL LOW (ref 12.0–15.0)
Immature Granulocytes: 0 %
Lymphocytes Relative: 25 %
Lymphs Abs: 2 10*3/uL (ref 0.7–4.0)
MCH: 27.7 pg (ref 26.0–34.0)
MCHC: 32.9 g/dL (ref 30.0–36.0)
MCV: 84.2 fL (ref 80.0–100.0)
Monocytes Absolute: 0.5 10*3/uL (ref 0.1–1.0)
Monocytes Relative: 7 %
Neutro Abs: 5.2 10*3/uL (ref 1.7–7.7)
Neutrophils Relative %: 66 %
Platelets: 340 10*3/uL (ref 150–400)
RBC: 4.12 MIL/uL (ref 3.87–5.11)
RDW: 17.1 % — ABNORMAL HIGH (ref 11.5–15.5)
WBC: 7.8 10*3/uL (ref 4.0–10.5)
nRBC: 0 % (ref 0.0–0.2)

## 2022-11-24 LAB — COMPREHENSIVE METABOLIC PANEL
ALT: 17 U/L (ref 0–44)
AST: 16 U/L (ref 15–41)
Albumin: 3.1 g/dL — ABNORMAL LOW (ref 3.5–5.0)
Alkaline Phosphatase: 71 U/L (ref 38–126)
Anion gap: 8 (ref 5–15)
BUN: 12 mg/dL (ref 6–20)
CO2: 23 mmol/L (ref 22–32)
Calcium: 8.7 mg/dL — ABNORMAL LOW (ref 8.9–10.3)
Chloride: 111 mmol/L (ref 98–111)
Creatinine, Ser: 0.81 mg/dL (ref 0.44–1.00)
GFR, Estimated: >60 mL/min (ref >60)
Glucose, Bld: 94 mg/dL (ref 70–99)
Potassium: 3.8 mmol/L (ref 3.5–5.1)
Sodium: 142 mmol/L (ref 135–145)
Total Bilirubin: 0.3 mg/dL (ref 0.3–1.2)
Total Protein: 6.3 g/dL — ABNORMAL LOW (ref 6.5–8.1)

## 2022-11-24 MED ORDER — LABETALOL HCL 5 MG/ML IV SOLN: 80.0000 mg | INTRAVENOUS | Status: DC | PRN

## 2022-11-24 MED ORDER — LABETALOL HCL 5 MG/ML IV SOLN: 20.0000 mg | INTRAVENOUS | Status: DC | PRN

## 2022-11-24 MED ORDER — NIFEDIPINE ER OSMOTIC RELEASE 30 MG PO TB24
30.0000 mg | ORAL_TABLET | Freq: Every day | ORAL | 0 refills | Status: AC
Start: 2022-11-24 — End: ?

## 2022-11-24 MED ORDER — HYDRALAZINE HCL 20 MG/ML IJ SOLN
5.0000 mg | INTRAMUSCULAR | Status: DC | PRN
  Administered 2022-11-24: 5 mg via INTRAVENOUS
  Filled 2022-11-24: qty 1

## 2022-11-24 MED ORDER — NIFEDIPINE ER OSMOTIC RELEASE 30 MG PO TB24
30.0000 mg | ORAL_TABLET | Freq: Once | ORAL | Status: AC
  Administered 2022-11-24: 30 mg via ORAL
  Filled 2022-11-24: qty 1

## 2022-11-24 MED ORDER — HYDRALAZINE HCL 20 MG/ML IJ SOLN: 10.0000 mg | INTRAMUSCULAR | Status: DC | PRN

## 2022-11-24 MED ORDER — LABETALOL HCL 5 MG/ML IV SOLN: 40.0000 mg | INTRAVENOUS | Status: DC | PRN

## 2022-11-24 NOTE — MAU Provider Note (Signed)
History     CSN: [id number]  Arrival date and time: 11/24/22 1634   Event Date/Time   First Provider Initiated Contact with Patient 11/24/22 1702      Chief Complaint  Patient presents with   Wound Check   HPI Brittany Collins is a 32 y.o. year old G27P2002 female who is s/p RCS from 10 days ago and a recent readmission for PP PEC and placed on MgSO4; d/c'd 2 days ago. She presents to MAU reporting redness and puss coming from her incision. She reports the irritation at the incision started yesterday. She denies pain or odor at the incision. She was not prescribed any medications for BP when she was d/c'd from the hospital 2 days ago. She reports,"I was d/c'd with a BP and watched a YouTube video on how to use it. So, I think I'm using it correctly, but my BPs are always up and down." She receives [facility]; next appt is next week.    OB History     Gravida  2   Para  2   Term  2   Preterm      AB      Living  2      SAB      IAB      Ectopic      Multiple  0   Live Births  2           Past Medical History:  Diagnosis Date   ADHD    Allergic rhinitis    Anxiety    Chicken pox    Chronic pansinusitis    GERD (gastroesophageal reflux disease)    Headache    History of kidney stones    Migraines     Past Surgical History:  Procedure Laterality Date   CESAREAN SECTION N/A 09/02/2020   Procedure: PRIMARY CESAREAN SECTION EDC: 09-05-20 ALLERG: SULFA, TRAMADOL;  Surgeon: Marcelle Overlie, MD;  Location: [facility] LD ORS;  Service: Obstetrics;  Laterality: N/A;   CESAREAN SECTION N/A 11/14/2022   Procedure: REPEAT SECTION  REPEAT X1;  Surgeon: Marcelle Overlie, MD;  Location: [facility] LD ORS;  Service: Obstetrics;  Laterality: N/A;   WISDOM TOOTH EXTRACTION      Family History  Problem Relation Age of Onset   Arthritis Mother    Hypertension Mother    Sleep apnea Mother    Heart disease Father    Heart disease Maternal Grandmother     Diabetes Maternal Grandmother    Sleep apnea Maternal Grandmother    Heart disease Maternal Grandfather    Diabetes Maternal Grandfather    Stroke Paternal Grandfather     Social History   Tobacco Use   Smoking status: Former    Current packs/day: 1.00    Average packs/day: 1 pack/day for 7.0 years (7.0 ttl pk-yrs)    Types: Cigarettes   Smokeless tobacco: Never  Vaping Use   Vaping status: Never Used  Substance Use Topics   Alcohol use: Yes    Comment: <1 per month   Drug use: No    Allergies:  Allergies  Allergen Reactions   Tramadol Other (See Comments)    headache migraines    Sulfa Antibiotics Other (See Comments)    Reaction unknown   Sulfamethoxazole     Other reaction(s): Confusion (intolerance)    No medications prior to admission.    Review of Systems Physical Exam   Patient Vitals for the past 24 hrs:  BP Temp Temp src Pulse Resp SpO2  11/24/22 1901 (!) 155/80 -- -- (!) 56 -- --  11/24/22 1846 (!) 142/74 -- -- 63 -- --  11/24/22 1831 (!) 143/74 -- -- (!) 58 -- --  11/24/22 1816 (!) 152/80 -- -- (!) 47 -- --  11/24/22 1802 (!) 167/84 -- -- (!) 46 -- --  11/24/22 1746 (!) 162/92 -- -- (!) 50 -- --  11/24/22 1731 (!) 150/82 -- -- (!) 51 -- --  11/24/22 1716 (!) 146/79 -- -- (!) 56 -- --  11/24/22 1702 138/78 -- -- (!) 59 -- --  11/24/22 1644 (!) 152/72 98.1 F (36.7 C) Oral (!) 55 17 99 %    Physical Exam Vitals and nursing note reviewed. Exam conducted with a chaperone present.  Constitutional:      Appearance: Normal appearance. She is normal weight.  Cardiovascular:     Rate and Rhythm: Bradycardia present.  Pulmonary:     Effort: Pulmonary effort is normal.  Abdominal:     General: Abdomen is flat.     Palpations: Abdomen is soft.  Genitourinary:    Comments: Not indicated Musculoskeletal:        General: Normal range of motion.  Skin:    General: Skin is warm and dry.     Comments: Edges of LT end of incision well approximated with  3 cm length of puckering, wet skin surface. Probed with saline soaked cotton-tipped swab. No interruption of incision line found. Area above and below incision pressed with firm pressure; no drainage. Patient tolerated procedure well.  Neurological:     Mental Status: She is alert and oriented to person, place, and time.  Psychiatric:        Mood and Affect: Mood normal.        Behavior: Behavior normal.        Thought Content: Thought content normal.        Judgment: Judgment normal.    MAU Course  Procedures  MDM CBC w/Diff CMP Serial BPs Procardia XL 30 mg po -- 2 severe range BP 1 hour after dose Insert Saline Lock Initiate Hydralazine Protocol (d/t bradycardia)  Results for orders placed or performed during the hospital encounter of 11/24/22 (from the past 24 hour(s))  CBC with Differential/Platelet     Status: Abnormal   Collection Time: 11/24/22  5:16 PM  Result Value Ref Range   WBC 7.8 4.0 - 10.5 K/uL   RBC 4.12 3.87 - 5.11 MIL/uL   Hemoglobin 11.4 (L) 12.0 - 15.0 g/dL   HCT 16.1 (L) 09.6 - 04.5 %   MCV 84.2 80.0 - 100.0 fL   MCH 27.7 26.0 - 34.0 pg   MCHC 32.9 30.0 - 36.0 g/dL   RDW 40.9 (H) 81.1 - 91.4 %   Platelets 340 150 - 400 K/uL   nRBC 0.0 0.0 - 0.2 %   Neutrophils Relative % 66 %   Neutro Abs 5.2 1.7 - 7.7 K/uL   Lymphocytes Relative 25 %   Lymphs Abs 2.0 0.7 - 4.0 K/uL   Monocytes Relative 7 %   Monocytes Absolute 0.5 0.1 - 1.0 K/uL   Eosinophils Relative 2 %   Eosinophils Absolute 0.1 0.0 - 0.5 K/uL   Basophils Relative 0 %   Basophils Absolute 0.0 0.0 - 0.1 K/uL   Immature Granulocytes 0 %   Abs Immature Granulocytes 0.02 0.00 - 0.07 K/uL  Comprehensive metabolic panel     Status: Abnormal   Collection  Time: 11/24/22  5:16 PM  Result Value Ref Range   Sodium 142 135 - 145 mmol/L   Potassium 3.8 3.5 - 5.1 mmol/L   Chloride 111 98 - 111 mmol/L   CO2 23 22 - 32 mmol/L   Glucose, Bld 94 70 - 99 mg/dL   BUN 12 6 - 20 mg/dL   Creatinine, Ser 4.09  0.44 - 1.00 mg/dL   Calcium 8.7 (L) 8.9 - 10.3 mg/dL   Total Protein 6.3 (L) 6.5 - 8.1 g/dL   Albumin 3.1 (L) 3.5 - 5.0 g/dL   AST 16 15 - 41 U/L   ALT 17 0 - 44 U/L   Alkaline Phosphatase 71 38 - 126 U/L   Total Bilirubin 0.3 0.3 - 1.2 mg/dL   GFR, Estimated >81 >19 mL/min   Anion gap 8 5 - 15   Assessment and Plan  1. Encounter for postoperative wound check - Reassurance given that normal wound healing is taking place and there are not s/sx's of wound infection - CBC w/Diff WNL  2. Postpartum hypertension - Information provided on PP HTN, how to take BP, a form to record BPs, and info on Procardia XL - Rx: Procardia XL 30 mg po daily - Advised to return to MAU for BPs of >/=160/110  - Discharge patient - Call to make F/U appt with [facility] next week (By Tuesday no later than Wednesday) - Patient verbalized an understanding of the plan of care and agrees.     Raelyn Mora, CNM 11/24/2022, 5:02 PM

## 2022-11-24 NOTE — MAU Note (Signed)
.  Brittany Collins is a 32 y.o. at Unknown here in MAU reporting: redness and puss coming from incision.  Denies pain or odor.  States the irritation started yesterday   Onset of complaint: yesterday Pain score: 0 Vitals:   11/24/22 1644  BP: (!) 152/72  Pulse: (!) 55  Resp: 17  Temp: 98.1 F (36.7 C)  SpO2: 99%      Lab orders placed from triage:

## 2022-12-06 ENCOUNTER — Telehealth (HOSPITAL_COMMUNITY): Payer: Self-pay | Admitting: *Deleted

## 2022-12-06 NOTE — Telephone Encounter (Signed)
12/06/2022  Name: Brittany Collins MRN: [id number] DOB: 26-Sep-1990  Reason for Call:  Transition of Care Hospital Discharge Call  Contact Status: Patient Contact Status: Message  Language assistant needed:          Follow-Up Questions:    Inocente Salles Postnatal Depression Scale:  In the Past 7 Days:    PHQ2-9 Depression Scale:     Discharge Follow-up:    Post-discharge interventions: NA  Salena Saner, RN 12/06/2022 10:14

## 2022-12-17 ENCOUNTER — Other Ambulatory Visit: Payer: Self-pay | Admitting: Obstetrics and Gynecology

## 2023-08-12 ENCOUNTER — Other Ambulatory Visit: Payer: Self-pay | Admitting: Obstetrics and Gynecology
